# Patient Record
Sex: Female | Born: 1945
Health system: Southern US, Community
[De-identification: ages and names within clinical notes are randomized; demographics above are authoritative.]

## PROBLEM LIST (undated history)

## (undated) DIAGNOSIS — E119 Type 2 diabetes mellitus without complications: Secondary | ICD-10-CM

## (undated) DIAGNOSIS — D128 Benign neoplasm of rectum: Secondary | ICD-10-CM

## (undated) DIAGNOSIS — I872 Venous insufficiency (chronic) (peripheral): Secondary | ICD-10-CM

## (undated) DIAGNOSIS — K5790 Diverticulosis of intestine, part unspecified, without perforation or abscess without bleeding: Secondary | ICD-10-CM

## (undated) DIAGNOSIS — M6740 Ganglion, unspecified site: Secondary | ICD-10-CM

## (undated) DIAGNOSIS — E782 Mixed hyperlipidemia: Secondary | ICD-10-CM

## (undated) DIAGNOSIS — D649 Anemia, unspecified: Secondary | ICD-10-CM

## (undated) DIAGNOSIS — I10 Essential (primary) hypertension: Secondary | ICD-10-CM

## (undated) DIAGNOSIS — M109 Gout, unspecified: Secondary | ICD-10-CM

## (undated) HISTORY — PX: ABDOMINAL HYSTERECTOMY: SHX81

## (undated) HISTORY — PX: TUBAL LIGATION: SHX77

## (undated) HISTORY — PX: COLONOSCOPY: SHX174

## (undated) HISTORY — PX: DIAGNOSTIC LAPAROSCOPY: SUR761

## (undated) HISTORY — PX: HYSTEROSCOPY WITH D & C: SHX1775

---

## 2005-06-24 ENCOUNTER — Ambulatory Visit: Payer: Self-pay | Admitting: Family Medicine

## 2005-08-10 ENCOUNTER — Ambulatory Visit: Payer: Self-pay

## 2006-08-15 ENCOUNTER — Ambulatory Visit: Payer: Self-pay | Admitting: Family Medicine

## 2006-12-22 ENCOUNTER — Ambulatory Visit: Payer: Self-pay | Admitting: Family Medicine

## 2007-09-12 ENCOUNTER — Ambulatory Visit: Payer: Self-pay | Admitting: Family Medicine

## 2008-09-16 ENCOUNTER — Ambulatory Visit: Payer: Self-pay | Admitting: Family Medicine

## 2008-09-26 ENCOUNTER — Ambulatory Visit: Payer: Self-pay | Admitting: Family Medicine

## 2008-10-24 ENCOUNTER — Ambulatory Visit: Payer: Self-pay | Admitting: Family Medicine

## 2009-07-06 ENCOUNTER — Ambulatory Visit: Payer: Self-pay | Admitting: Gastroenterology

## 2009-09-28 ENCOUNTER — Ambulatory Visit: Payer: Self-pay | Admitting: Family Medicine

## 2010-09-29 ENCOUNTER — Ambulatory Visit: Payer: Self-pay | Admitting: Family Medicine

## 2010-10-11 ENCOUNTER — Ambulatory Visit: Payer: Self-pay | Admitting: Family Medicine

## 2011-01-18 ENCOUNTER — Ambulatory Visit: Payer: Self-pay | Admitting: Family Medicine

## 2011-02-01 ENCOUNTER — Ambulatory Visit: Payer: Self-pay | Admitting: Obstetrics and Gynecology

## 2011-02-01 DIAGNOSIS — I10 Essential (primary) hypertension: Secondary | ICD-10-CM

## 2011-02-07 ENCOUNTER — Ambulatory Visit: Payer: Self-pay | Admitting: Obstetrics and Gynecology

## 2011-02-09 LAB — PATHOLOGY REPORT

## 2011-04-13 ENCOUNTER — Ambulatory Visit: Payer: Self-pay | Admitting: Family Medicine

## 2011-07-25 ENCOUNTER — Ambulatory Visit: Payer: Self-pay | Admitting: Obstetrics and Gynecology

## 2011-08-03 ENCOUNTER — Inpatient Hospital Stay: Payer: Self-pay | Admitting: Obstetrics and Gynecology

## 2011-08-08 ENCOUNTER — Ambulatory Visit: Payer: Self-pay | Admitting: Obstetrics and Gynecology

## 2011-11-22 ENCOUNTER — Ambulatory Visit: Payer: Self-pay | Admitting: Family Medicine

## 2012-12-26 ENCOUNTER — Ambulatory Visit: Payer: Self-pay | Admitting: Family Medicine

## 2014-01-27 ENCOUNTER — Ambulatory Visit: Payer: Self-pay | Admitting: Family Medicine

## 2014-06-30 DIAGNOSIS — Z8739 Personal history of other diseases of the musculoskeletal system and connective tissue: Secondary | ICD-10-CM | POA: Insufficient documentation

## 2014-08-01 DIAGNOSIS — Z86010 Personal history of colonic polyps: Secondary | ICD-10-CM | POA: Insufficient documentation

## 2014-08-19 ENCOUNTER — Ambulatory Visit: Payer: Self-pay | Admitting: Gastroenterology

## 2015-01-28 ENCOUNTER — Ambulatory Visit: Payer: Self-pay | Admitting: Family Medicine

## 2016-01-14 ENCOUNTER — Other Ambulatory Visit: Payer: Self-pay | Admitting: Family Medicine

## 2016-01-14 DIAGNOSIS — Z7189 Other specified counseling: Secondary | ICD-10-CM | POA: Insufficient documentation

## 2016-01-14 DIAGNOSIS — Z7185 Encounter for immunization safety counseling: Secondary | ICD-10-CM | POA: Insufficient documentation

## 2016-01-14 DIAGNOSIS — Z1231 Encounter for screening mammogram for malignant neoplasm of breast: Secondary | ICD-10-CM

## 2016-02-01 ENCOUNTER — Other Ambulatory Visit: Payer: Self-pay | Admitting: Family Medicine

## 2016-02-01 ENCOUNTER — Ambulatory Visit
Admission: RE | Admit: 2016-02-01 | Discharge: 2016-02-01 | Disposition: A | Discharge status: Home / Self Care | Payer: Medicare Other | Source: Ambulatory Visit | Attending: Family Medicine | Admitting: Family Medicine

## 2016-02-01 DIAGNOSIS — Z1231 Encounter for screening mammogram for malignant neoplasm of breast: Secondary | ICD-10-CM

## 2016-09-16 DIAGNOSIS — Z0000 Encounter for general adult medical examination without abnormal findings: Secondary | ICD-10-CM | POA: Insufficient documentation

## 2017-03-06 ENCOUNTER — Other Ambulatory Visit: Payer: Self-pay | Admitting: Family Medicine

## 2017-03-06 DIAGNOSIS — Z1231 Encounter for screening mammogram for malignant neoplasm of breast: Secondary | ICD-10-CM

## 2017-04-03 ENCOUNTER — Ambulatory Visit
Admission: RE | Admit: 2017-04-03 | Discharge: 2017-04-03 | Disposition: A | Discharge status: Home / Self Care | Payer: Medicare Other | Source: Ambulatory Visit | Attending: Family Medicine | Admitting: Family Medicine

## 2017-04-03 DIAGNOSIS — Z1231 Encounter for screening mammogram for malignant neoplasm of breast: Secondary | ICD-10-CM | POA: Diagnosis present

## 2018-03-08 ENCOUNTER — Other Ambulatory Visit: Payer: Self-pay | Admitting: Emergency Medicine

## 2018-06-20 ENCOUNTER — Other Ambulatory Visit: Payer: Self-pay | Admitting: Family Medicine

## 2018-06-20 DIAGNOSIS — Z1231 Encounter for screening mammogram for malignant neoplasm of breast: Secondary | ICD-10-CM

## 2018-06-29 DIAGNOSIS — R011 Cardiac murmur, unspecified: Secondary | ICD-10-CM | POA: Insufficient documentation

## 2018-07-10 ENCOUNTER — Ambulatory Visit
Admission: RE | Admit: 2018-07-10 | Discharge: 2018-07-10 | Disposition: A | Discharge status: Home / Self Care | Payer: Medicare Other | Source: Ambulatory Visit | Attending: Family Medicine | Admitting: Family Medicine

## 2018-07-10 DIAGNOSIS — Z1231 Encounter for screening mammogram for malignant neoplasm of breast: Secondary | ICD-10-CM | POA: Diagnosis not present

## 2019-03-06 DIAGNOSIS — N183 Chronic kidney disease, stage 3 unspecified: Secondary | ICD-10-CM | POA: Insufficient documentation

## 2019-08-02 ENCOUNTER — Other Ambulatory Visit: Payer: Self-pay | Admitting: Family Medicine

## 2019-08-02 DIAGNOSIS — Z1231 Encounter for screening mammogram for malignant neoplasm of breast: Secondary | ICD-10-CM

## 2019-09-04 ENCOUNTER — Ambulatory Visit
Admission: RE | Admit: 2019-09-04 | Discharge: 2019-09-04 | Disposition: A | Discharge status: Home / Self Care | Payer: Medicare Other | Source: Ambulatory Visit | Attending: Family Medicine | Admitting: Family Medicine

## 2019-09-04 DIAGNOSIS — Z1231 Encounter for screening mammogram for malignant neoplasm of breast: Secondary | ICD-10-CM | POA: Insufficient documentation

## 2019-11-04 ENCOUNTER — Other Ambulatory Visit
Admission: RE | Admit: 2019-11-04 | Discharge: 2019-11-04 | Disposition: A | Discharge status: Home / Self Care | Payer: Medicare Other | Source: Ambulatory Visit | Attending: Internal Medicine | Admitting: Internal Medicine

## 2019-11-04 ENCOUNTER — Other Ambulatory Visit: Payer: Self-pay

## 2019-11-04 DIAGNOSIS — Z01812 Encounter for preprocedural laboratory examination: Secondary | ICD-10-CM | POA: Diagnosis present

## 2019-11-04 DIAGNOSIS — Z20828 Contact with and (suspected) exposure to other viral communicable diseases: Secondary | ICD-10-CM | POA: Diagnosis not present

## 2019-11-04 LAB — SARS CORONAVIRUS 2 (TAT 6-24 HRS): SARS Coronavirus 2: NEGATIVE

## 2019-11-06 ENCOUNTER — Encounter: Payer: Self-pay | Admitting: *Deleted

## 2019-11-07 ENCOUNTER — Ambulatory Visit
Admission: RE | Admit: 2019-11-07 | Discharge: 2019-11-07 | Disposition: A | Discharge status: Home / Self Care | Payer: Medicare Other | Attending: Internal Medicine | Admitting: Internal Medicine

## 2019-11-07 ENCOUNTER — Ambulatory Visit: Payer: Medicare Other | Admitting: Certified Registered Nurse Anesthetist

## 2019-11-07 ENCOUNTER — Encounter
Admission: RE | Disposition: A | Discharge status: Home / Self Care | Payer: Self-pay | Source: Home / Self Care | Attending: Internal Medicine

## 2019-11-07 ENCOUNTER — Other Ambulatory Visit: Payer: Self-pay

## 2019-11-07 ENCOUNTER — Encounter: Payer: Self-pay | Admitting: *Deleted

## 2019-11-07 DIAGNOSIS — K640 First degree hemorrhoids: Secondary | ICD-10-CM | POA: Insufficient documentation

## 2019-11-07 DIAGNOSIS — Z1211 Encounter for screening for malignant neoplasm of colon: Secondary | ICD-10-CM | POA: Insufficient documentation

## 2019-11-07 DIAGNOSIS — Z7984 Long term (current) use of oral hypoglycemic drugs: Secondary | ICD-10-CM | POA: Insufficient documentation

## 2019-11-07 DIAGNOSIS — Z79899 Other long term (current) drug therapy: Secondary | ICD-10-CM | POA: Insufficient documentation

## 2019-11-07 DIAGNOSIS — I10 Essential (primary) hypertension: Secondary | ICD-10-CM | POA: Diagnosis not present

## 2019-11-07 DIAGNOSIS — K5730 Diverticulosis of large intestine without perforation or abscess without bleeding: Secondary | ICD-10-CM | POA: Diagnosis not present

## 2019-11-07 DIAGNOSIS — Z86010 Personal history of colonic polyps: Secondary | ICD-10-CM | POA: Insufficient documentation

## 2019-11-07 DIAGNOSIS — E782 Mixed hyperlipidemia: Secondary | ICD-10-CM | POA: Insufficient documentation

## 2019-11-07 DIAGNOSIS — M109 Gout, unspecified: Secondary | ICD-10-CM | POA: Diagnosis not present

## 2019-11-07 DIAGNOSIS — E119 Type 2 diabetes mellitus without complications: Secondary | ICD-10-CM | POA: Insufficient documentation

## 2019-11-07 DIAGNOSIS — I872 Venous insufficiency (chronic) (peripheral): Secondary | ICD-10-CM | POA: Diagnosis not present

## 2019-11-07 DIAGNOSIS — Z6839 Body mass index (BMI) 39.0-39.9, adult: Secondary | ICD-10-CM | POA: Diagnosis not present

## 2019-11-07 DIAGNOSIS — Z7982 Long term (current) use of aspirin: Secondary | ICD-10-CM | POA: Insufficient documentation

## 2019-11-07 HISTORY — DX: Ganglion, unspecified site: M67.40

## 2019-11-07 HISTORY — DX: Essential (primary) hypertension: I10

## 2019-11-07 HISTORY — DX: Diverticulosis of intestine, part unspecified, without perforation or abscess without bleeding: K57.90

## 2019-11-07 HISTORY — DX: Gout, unspecified: M10.9

## 2019-11-07 HISTORY — DX: Venous insufficiency (chronic) (peripheral): I87.2

## 2019-11-07 HISTORY — PX: COLONOSCOPY WITH PROPOFOL: SHX5780

## 2019-11-07 HISTORY — DX: Anemia, unspecified: D64.9

## 2019-11-07 HISTORY — DX: Morbid (severe) obesity due to excess calories: E66.01

## 2019-11-07 HISTORY — DX: Benign neoplasm of rectum: D12.8

## 2019-11-07 HISTORY — DX: Mixed hyperlipidemia: E78.2

## 2019-11-07 HISTORY — DX: Type 2 diabetes mellitus without complications: E11.9

## 2019-11-07 LAB — GLUCOSE, CAPILLARY: Glucose-Capillary: 150 mg/dL — ABNORMAL HIGH (ref 70–99)

## 2019-11-07 SURGERY — COLONOSCOPY WITH PROPOFOL
Anesthesia: General

## 2019-11-07 MED ORDER — PROPOFOL 10 MG/ML IV BOLUS
INTRAVENOUS | Status: AC
  Filled 2019-11-07: qty 100

## 2019-11-07 MED ORDER — SODIUM CHLORIDE 0.9 % IV SOLN
INTRAVENOUS | Status: DC
  Administered 2019-11-07: 1000 mL via INTRAVENOUS

## 2019-11-07 MED ORDER — PROPOFOL 500 MG/50ML IV EMUL
INTRAVENOUS | Status: DC | PRN
  Administered 2019-11-07: 100 ug/kg/min via INTRAVENOUS

## 2019-11-07 MED ORDER — LIDOCAINE HCL (CARDIAC) PF 100 MG/5ML IV SOSY
PREFILLED_SYRINGE | INTRAVENOUS | Status: DC | PRN
  Administered 2019-11-07: 100 mg via INTRAVENOUS

## 2019-11-07 MED ORDER — PROPOFOL 10 MG/ML IV BOLUS
INTRAVENOUS | Status: DC | PRN
  Administered 2019-11-07: 50 mg via INTRAVENOUS

## 2019-11-07 NOTE — H&P (Signed)
Outpatient short stay form Pre-procedure 11/07/2019 8:30 AM Crystal Terrell, M.D.  Primary Physician: Dion Body, M.D.  Reason for visit:  Personal hx of adenomatous colon polyps (2010)  History of present illness:                            Patient presents for colonoscopy for a personal hx of colon polyps. The patient denies abdominal pain, abnormal weight loss or rectal bleeding.     Current Facility-Administered Medications:  .  0.9 %  sodium chloride infusion, , Intravenous, Continuous, [place], Benay Pike, MD  Medications Prior to Admission  Medication Sig Dispense Refill Last Dose  . allopurinol (ZYLOPRIM) 100 MG tablet Take 100 mg by mouth daily.     Marland Kitchen amLODipine (NORVASC) 10 MG tablet Take 10 mg by mouth daily.   11/07/2019 at Unknown time  . aspirin EC 81 MG tablet Take 81 mg by mouth daily.     . fenofibrate 160 MG tablet Take 160 mg by mouth daily.     . furosemide (LASIX) 40 MG tablet Take 40 mg by mouth.     Marland Kitchen glimepiride (AMARYL) 4 MG tablet Take 4 mg by mouth 2 (two) times daily with a meal.     . losartan (COZAAR) 100 MG tablet Take 100 mg by mouth daily.   11/07/2019 at Unknown time  . lovastatin (MEVACOR) 20 MG tablet Take 20 mg by mouth daily.     . metoprolol succinate (TOPROL-XL) 100 MG 24 hr tablet Take 100 mg by mouth daily. Take with or immediately following a meal.   11/07/2019 at Unknown time  . naproxen sodium (ALEVE) 220 MG tablet Take 220 mg by mouth daily as needed.        Allergies  Allergen Reactions  . Metformin And Related   . Sulfa Antibiotics      Past Medical History:  Diagnosis Date  . Adenoma of rectum   . Anemia   . Diabetes mellitus without complication ([place])   . Diverticulosis   . Ganglion cyst    right  . Gout   . Hypertension   . Mixed hyperlipidemia   . Morbid obesity with BMI of 40.0-44.9, adult ([place])   . Venous insufficiency of lower extremity     Review of systems:  Otherwise negative.    Physical  Exam  Gen: Alert, oriented. Appears stated age.  HEENT: [place]/AT. PERRLA. Lungs: CTA, no wheezes. CV: RR nl S1, S2. Abd: soft, benign, no masses. BS+ Ext: No edema. Pulses 2+    Planned procedures: Proceed with colonoscopy. The patient understands the nature of the planned procedure, indications, risks, alternatives and potential complications including but not limited to bleeding, infection, perforation, damage to internal organs and possible oversedation/side effects from anesthesia. The patient agrees and gives consent to proceed.  Please refer to procedure notes for findings, recommendations and patient disposition/instructions.     Crystal Terrell, M.D. Gastroenterology 11/07/2019  8:30 AM

## 2019-11-07 NOTE — Transfer of Care (Signed)
Immediate Anesthesia Transfer of Care Note  Patient: Crystal Terrell  Procedure(s) Performed: COLONOSCOPY WITH PROPOFOL (N/A )  Patient Location: PACU and Endoscopy Unit  Anesthesia Type:General  Level of Consciousness: awake, drowsy and patient cooperative  Airway & Oxygen Therapy: Patient Spontanous Breathing  Post-op Assessment: Report given to RN and Post -op Vital signs reviewed and stable  Post vital signs: Reviewed and stable  Last Vitals:  Vitals Value Taken Time  BP    Temp    Pulse    Resp    SpO2      Last Pain:  Vitals:   11/07/19 0824  TempSrc: Skin  PainSc: 0-No pain         Complications: No apparent anesthesia complications

## 2019-11-07 NOTE — Anesthesia Postprocedure Evaluation (Signed)
Anesthesia Post Note  Patient: Crystal Terrell  Procedure(s) Performed: COLONOSCOPY WITH PROPOFOL (N/A )  Patient location during evaluation: Endoscopy Anesthesia Type: General Level of consciousness: awake and alert Pain management: pain level controlled Vital Signs Assessment: post-procedure vital signs reviewed and stable Respiratory status: spontaneous breathing, nonlabored ventilation, respiratory function stable and patient connected to nasal cannula oxygen Cardiovascular status: blood pressure returned to baseline and stable Postop Assessment: no apparent nausea or vomiting Anesthetic complications: no     Last Vitals:  Vitals:   11/07/19 0959 11/07/19 1009  BP: 134/65 (!) 142/70  Pulse: (!) 54 (!) 59  Resp: 16 12  Temp:    SpO2: 100% 99%    Last Pain:  Vitals:   11/07/19 1009  TempSrc:   PainSc: 0-No pain                 Martha Clan

## 2019-11-07 NOTE — Op Note (Signed)
[facility] Gastroenterology Patient Name: Crystal Terrell Procedure Date: 11/07/2019 9:10 AM MRN: [id number] Account #: [id number] Date of Birth: 04-04-1946 Admit Type: Outpatient Age: 73 Room: [facility] ENDO ROOM 3 Gender: Female Note Status: Finalized Procedure:             Colonoscopy Indications:           High risk colon cancer surveillance: Personal history                         of non-advanced adenoma Providers:             Benay Pike. Nidia Grogan MD, MD Medicines:             Propofol per Anesthesia Complications:         No immediate complications. Procedure:             Pre-Anesthesia Assessment:                        - The risks and benefits of the procedure and the                         sedation options and risks were discussed with the                         patient. All questions were answered and informed                         consent was obtained.                        - Patient identification and proposed procedure were                         verified prior to the procedure by the nurse. The                         procedure was verified in the procedure room.                        - ASA Grade Assessment: III - A patient with severe                         systemic disease.                        - After reviewing the risks and benefits, the patient                         was deemed in satisfactory condition to undergo the                         procedure.                        After obtaining informed consent, the colonoscope was                         passed under direct vision. Throughout the procedure,  the patient's blood pressure, pulse, and oxygen                         saturations were monitored continuously. The                         Colonoscope was introduced through the anus and                         advanced to the the cecum, identified by appendiceal                         orifice and ileocecal  valve. The colonoscopy was                         performed without difficulty. The patient tolerated                         the procedure well. The quality of the bowel                         preparation was adequate. The ileocecal valve,                         appendiceal orifice, and rectum were photographed. Findings:      The perianal and digital rectal examinations were normal. Pertinent       negatives include normal sphincter tone and no palpable rectal lesions.      Many small-mouthed diverticula were found in the sigmoid colon.      Non-bleeding internal hemorrhoids were found during retroflexion. The       hemorrhoids were Grade I (internal hemorrhoids that do not prolapse).      The exam was otherwise without abnormality. Impression:            - Diverticulosis in the sigmoid colon.                        - Non-bleeding internal hemorrhoids.                        - The examination was otherwise normal.                        - No specimens collected. Recommendation:        - Patient has a contact number available for                         emergencies. The signs and symptoms of potential                         delayed complications were discussed with the patient.                         Return to normal activities tomorrow. Written                         discharge instructions were provided to the patient.                        -  Resume previous diet.                        - Continue present medications.                        - No repeat colonoscopy due to current age (48 years                         or older) and the absence of advanced adenomas.                        - Return to GI office PRN.                        - The findings and recommendations were discussed with                         the patient. Procedure Code(s):     --- Professional ---                        [id number], Colorectal cancer screening; colonoscopy on                         individual at  high risk Diagnosis Code(s):     --- Professional ---                        K57.30, Diverticulosis of large intestine without                         perforation or abscess without bleeding                        K64.0, First degree hemorrhoids                        Z86.010, Personal history of colonic polyps CPT copyright 2019 American Medical Association. All rights reserved. The codes documented in this report are preliminary and upon coder review may  be revised to meet current compliance requirements. Efrain Sella MD, MD 11/07/2019 9:38:45 AM This report has been signed electronically. Number of Addenda: 0 Note Initiated On: 11/07/2019 9:10 AM Scope Withdrawal Time: 0 hours 5 minutes 33 seconds  Total Procedure Duration: 0 hours 12 minutes 40 seconds  Estimated Blood Loss:  Estimated blood loss: none.      [facility]

## 2019-11-07 NOTE — Interval H&P Note (Signed)
History and Physical Interval Note:  11/07/2019 8:31 AM  Crystal Terrell  has presented today for surgery, with the diagnosis of PERSONAL HX.OF COLON POLYPS.  The various methods of treatment have been discussed with the patient and family. After consideration of risks, benefits and other options for treatment, the patient has consented to  Procedure(s): COLONOSCOPY WITH PROPOFOL (N/A) as a surgical intervention.  The patient's history has been reviewed, patient examined, no change in status, stable for surgery.  I have reviewed the patient's chart and labs.  Questions were answered to the patient's satisfaction.     [place], [place]

## 2019-11-07 NOTE — Anesthesia Preprocedure Evaluation (Signed)
Anesthesia Evaluation  Patient identified by MRN, date of birth, ID band Patient awake    Reviewed: Allergy & Precautions, H&P , NPO status , Patient's Chart, lab work & pertinent test results, reviewed documented beta blocker date and time   History of Anesthesia Complications Negative for: history of anesthetic complications  Airway Mallampati: IV  TM Distance: >3 FB Neck ROM: full    Dental  (+) Dental Advidsory Given, Partial Upper, Edentulous Lower   Pulmonary neg pulmonary ROS,    Pulmonary exam normal        Cardiovascular Exercise Tolerance: Good hypertension, (-) angina(-) Past MI and (-) Cardiac Stents Normal cardiovascular exam(-) dysrhythmias + Valvular Problems/Murmurs      Neuro/Psych negative neurological ROS  negative psych ROS   GI/Hepatic negative GI ROS, Neg liver ROS,   Endo/Other  diabetesMorbid obesity  Renal/GU negative Renal ROS  negative genitourinary   Musculoskeletal   Abdominal   Peds  Hematology  (+) Blood dyscrasia, anemia ,   Anesthesia Other Findings Past Medical History: No date: Adenoma of rectum No date: Anemia No date: Diabetes mellitus without complication (HCC) No date: Diverticulosis No date: Ganglion cyst     Comment:  right No date: Gout No date: Hypertension No date: Mixed hyperlipidemia No date: Morbid obesity with BMI of 40.0-44.9, adult (HCC) No date: Venous insufficiency of lower extremity   Reproductive/Obstetrics negative OB ROS                             Anesthesia Physical Anesthesia Plan  ASA: III  Anesthesia Plan: General   Post-op Pain Management:    Induction: Intravenous  PONV Risk Score and Plan: 3 and Propofol infusion and TIVA  Airway Management Planned: Natural Airway and Nasal Cannula  Additional Equipment:   Intra-op Plan:   Post-operative Plan:   Informed Consent: I have reviewed the patients History  and Physical, chart, labs and discussed the procedure including the risks, benefits and alternatives for the proposed anesthesia with the patient or authorized representative who has indicated his/her understanding and acceptance.     Dental Advisory Given  Plan Discussed with: Anesthesiologist, CRNA and Surgeon  Anesthesia Plan Comments:         Anesthesia Quick Evaluation

## 2019-11-07 NOTE — Anesthesia Post-op Follow-up Note (Signed)
Anesthesia QCDR form completed.        

## 2019-11-08 ENCOUNTER — Encounter: Payer: Self-pay | Admitting: Internal Medicine

## 2019-12-18 ENCOUNTER — Emergency Department: Payer: Medicare Other

## 2019-12-18 ENCOUNTER — Inpatient Hospital Stay
Admission: EM | Admit: 2019-12-18 | Discharge: 2019-12-21 | DRG: 175 | Disposition: A | Discharge status: Home / Self Care | Payer: Medicare Other | Attending: Internal Medicine | Admitting: Internal Medicine

## 2019-12-18 ENCOUNTER — Inpatient Hospital Stay: Payer: Medicare Other

## 2019-12-18 ENCOUNTER — Other Ambulatory Visit: Payer: Self-pay

## 2019-12-18 DIAGNOSIS — Z86718 Personal history of other venous thrombosis and embolism: Secondary | ICD-10-CM

## 2019-12-18 DIAGNOSIS — Z7982 Long term (current) use of aspirin: Secondary | ICD-10-CM

## 2019-12-18 DIAGNOSIS — E1165 Type 2 diabetes mellitus with hyperglycemia: Secondary | ICD-10-CM | POA: Diagnosis present

## 2019-12-18 DIAGNOSIS — I248 Other forms of acute ischemic heart disease: Secondary | ICD-10-CM | POA: Diagnosis present

## 2019-12-18 DIAGNOSIS — I2609 Other pulmonary embolism with acute cor pulmonale: Secondary | ICD-10-CM | POA: Diagnosis not present

## 2019-12-18 DIAGNOSIS — I130 Hypertensive heart and chronic kidney disease with heart failure and stage 1 through stage 4 chronic kidney disease, or unspecified chronic kidney disease: Secondary | ICD-10-CM | POA: Diagnosis present

## 2019-12-18 DIAGNOSIS — E1169 Type 2 diabetes mellitus with other specified complication: Secondary | ICD-10-CM

## 2019-12-18 DIAGNOSIS — N183 Chronic kidney disease, stage 3 unspecified: Secondary | ICD-10-CM | POA: Diagnosis present

## 2019-12-18 DIAGNOSIS — Z79899 Other long term (current) drug therapy: Secondary | ICD-10-CM

## 2019-12-18 DIAGNOSIS — Z20822 Contact with and (suspected) exposure to covid-19: Secondary | ICD-10-CM | POA: Diagnosis present

## 2019-12-18 DIAGNOSIS — Z882 Allergy status to sulfonamides status: Secondary | ICD-10-CM

## 2019-12-18 DIAGNOSIS — I10 Essential (primary) hypertension: Secondary | ICD-10-CM | POA: Diagnosis not present

## 2019-12-18 DIAGNOSIS — Z6841 Body Mass Index (BMI) 40.0 and over, adult: Secondary | ICD-10-CM

## 2019-12-18 DIAGNOSIS — I82452 Acute embolism and thrombosis of left peroneal vein: Secondary | ICD-10-CM | POA: Diagnosis present

## 2019-12-18 DIAGNOSIS — Z888 Allergy status to other drugs, medicaments and biological substances status: Secondary | ICD-10-CM

## 2019-12-18 DIAGNOSIS — Z86711 Personal history of pulmonary embolism: Secondary | ICD-10-CM | POA: Diagnosis present

## 2019-12-18 DIAGNOSIS — M109 Gout, unspecified: Secondary | ICD-10-CM | POA: Diagnosis present

## 2019-12-18 DIAGNOSIS — I2699 Other pulmonary embolism without acute cor pulmonale: Secondary | ICD-10-CM | POA: Diagnosis present

## 2019-12-18 DIAGNOSIS — E119 Type 2 diabetes mellitus without complications: Secondary | ICD-10-CM | POA: Diagnosis not present

## 2019-12-18 DIAGNOSIS — I509 Heart failure, unspecified: Secondary | ICD-10-CM | POA: Diagnosis present

## 2019-12-18 DIAGNOSIS — J9601 Acute respiratory failure with hypoxia: Secondary | ICD-10-CM | POA: Diagnosis present

## 2019-12-18 DIAGNOSIS — E782 Mixed hyperlipidemia: Secondary | ICD-10-CM | POA: Diagnosis present

## 2019-12-18 DIAGNOSIS — E1122 Type 2 diabetes mellitus with diabetic chronic kidney disease: Secondary | ICD-10-CM | POA: Diagnosis present

## 2019-12-18 DIAGNOSIS — D631 Anemia in chronic kidney disease: Secondary | ICD-10-CM | POA: Diagnosis present

## 2019-12-18 DIAGNOSIS — R0602 Shortness of breath: Secondary | ICD-10-CM

## 2019-12-18 DIAGNOSIS — R0902 Hypoxemia: Secondary | ICD-10-CM | POA: Diagnosis not present

## 2019-12-18 LAB — APTT: aPTT: 29 s (ref 24–36)

## 2019-12-18 LAB — CBC WITH DIFFERENTIAL/PLATELET
Abs Immature Granulocytes: 0.03 10*3/uL (ref 0.00–0.07)
Basophils Absolute: 0 10*3/uL (ref 0.0–0.1)
Basophils Relative: 1 %
Eosinophils Absolute: 0 10*3/uL (ref 0.0–0.5)
Eosinophils Relative: 0 %
HCT: 38.5 % (ref 36.0–46.0)
Hemoglobin: 12.5 g/dL (ref 12.0–15.0)
Immature Granulocytes: 0 %
Lymphocytes Relative: 25 %
Lymphs Abs: 1.8 10*3/uL (ref 0.7–4.0)
MCH: 26.9 pg (ref 26.0–34.0)
MCHC: 32.5 g/dL (ref 30.0–36.0)
MCV: 82.8 fL (ref 80.0–100.0)
Monocytes Absolute: 0.5 10*3/uL (ref 0.1–1.0)
Monocytes Relative: 7 %
Neutro Abs: 5 10*3/uL (ref 1.7–7.7)
Neutrophils Relative %: 67 %
Platelets: 174 10*3/uL (ref 150–400)
RBC: 4.65 MIL/uL (ref 3.87–5.11)
RDW: 13.1 % (ref 11.5–15.5)
WBC: 7.5 10*3/uL (ref 4.0–10.5)
nRBC: 0 % (ref 0.0–0.2)

## 2019-12-18 LAB — COMPREHENSIVE METABOLIC PANEL
ALT: 22 U/L (ref 0–44)
AST: 28 U/L (ref 15–41)
Albumin: 4.4 g/dL (ref 3.5–5.0)
Alkaline Phosphatase: 61 U/L (ref 38–126)
Anion gap: 14 (ref 5–15)
BUN: 29 mg/dL — ABNORMAL HIGH (ref 8–23)
CO2: 26 mmol/L (ref 22–32)
Calcium: 9.9 mg/dL (ref 8.9–10.3)
Chloride: 101 mmol/L (ref 98–111)
Creatinine, Ser: 1.38 mg/dL — ABNORMAL HIGH (ref 0.44–1.00)
GFR calc Af Amer: 44 mL/min — ABNORMAL LOW (ref >60)
GFR calc non Af Amer: 38 mL/min — ABNORMAL LOW (ref >60)
Glucose, Bld: 233 mg/dL — ABNORMAL HIGH (ref 70–99)
Potassium: 3.7 mmol/L (ref 3.5–5.1)
Sodium: 141 mmol/L (ref 135–145)
Total Bilirubin: 0.9 mg/dL (ref 0.3–1.2)
Total Protein: 8 g/dL (ref 6.5–8.1)

## 2019-12-18 LAB — PROTIME-INR
INR: 1 (ref 0.8–1.2)
Prothrombin Time: 13.1 seconds (ref 11.4–15.2)

## 2019-12-18 LAB — GLUCOSE, CAPILLARY: Glucose-Capillary: 125 mg/dL — ABNORMAL HIGH (ref 70–99)

## 2019-12-18 LAB — TROPONIN I (HIGH SENSITIVITY): Troponin I (High Sensitivity): 64 ng/L — ABNORMAL HIGH (ref <18)

## 2019-12-18 LAB — PROCALCITONIN: Procalcitonin: <0.1 ng/mL

## 2019-12-18 LAB — RESPIRATORY PANEL BY RT PCR (FLU A&B, COVID)
Influenza A by PCR: NEGATIVE
Influenza B by PCR: NEGATIVE
SARS Coronavirus 2 by RT PCR: NEGATIVE

## 2019-12-18 LAB — POC SARS CORONAVIRUS 2 AG: SARS Coronavirus 2 Ag: NEGATIVE

## 2019-12-18 LAB — BRAIN NATRIURETIC PEPTIDE: B Natriuretic Peptide: 80 pg/mL (ref 0.0–100.0)

## 2019-12-18 LAB — MRSA PCR SCREENING: MRSA by PCR: NEGATIVE

## 2019-12-18 MED ORDER — CHLORHEXIDINE GLUCONATE CLOTH 2 % EX PADS
6.0000 | MEDICATED_PAD | Freq: Every day | CUTANEOUS | Status: DC
  Administered 2019-12-18: 6 via TOPICAL

## 2019-12-18 MED ORDER — ACETAMINOPHEN 325 MG PO TABS: 650.0000 mg | ORAL_TABLET | ORAL | Status: DC | PRN

## 2019-12-18 MED ORDER — INSULIN ASPART 100 UNIT/ML ~~LOC~~ SOLN
0.0000 [IU] | SUBCUTANEOUS | Status: DC
  Administered 2019-12-18 – 2019-12-19 (×2): 2 [IU] via SUBCUTANEOUS
  Administered 2019-12-19: 3 [IU] via SUBCUTANEOUS
  Administered 2019-12-20: 2 [IU] via SUBCUTANEOUS
  Administered 2019-12-20: 12:00:00 3 [IU] via SUBCUTANEOUS
  Administered 2019-12-20: 05:00:00 2 [IU] via SUBCUTANEOUS
  Administered 2019-12-20 – 2019-12-21 (×2): 3 [IU] via SUBCUTANEOUS
  Filled 2019-12-18 (×8): qty 1

## 2019-12-18 MED ORDER — HEPARIN BOLUS VIA INFUSION
4400.0000 [IU] | Freq: Once | INTRAVENOUS | Status: AC
  Administered 2019-12-18: 4400 [IU] via INTRAVENOUS
  Filled 2019-12-18: qty 4400

## 2019-12-18 MED ORDER — ONDANSETRON HCL 4 MG/2ML IJ SOLN: 4.0000 mg | Freq: Four times a day (QID) | INTRAMUSCULAR | Status: DC | PRN

## 2019-12-18 MED ORDER — PANTOPRAZOLE SODIUM 40 MG IV SOLR
40.0000 mg | Freq: Every day | INTRAVENOUS | Status: DC
  Administered 2019-12-18: 40 mg via INTRAVENOUS
  Filled 2019-12-18 (×2): qty 40

## 2019-12-18 MED ORDER — LACTATED RINGERS IV SOLN: INTRAVENOUS | Status: DC

## 2019-12-18 MED ORDER — HEPARIN (PORCINE) 25000 UT/250ML-% IV SOLN
1250.0000 [IU]/h | INTRAVENOUS | Status: DC
  Administered 2019-12-18: 1250 [IU]/h via INTRAVENOUS
  Filled 2019-12-18: qty 250

## 2019-12-18 MED ORDER — IPRATROPIUM-ALBUTEROL 0.5-2.5 (3) MG/3ML IN SOLN: 3.0000 mL | RESPIRATORY_TRACT | Status: DC | PRN

## 2019-12-18 MED ORDER — IOHEXOL 350 MG/ML SOLN
75.0000 mL | Freq: Once | INTRAVENOUS | Status: AC | PRN
  Administered 2019-12-18: 75 mL via INTRAVENOUS

## 2019-12-18 NOTE — ED Notes (Signed)
Pt given phone, phone charger, and glasses from family member.

## 2019-12-18 NOTE — ED Notes (Signed)
Pt's son updated on pt status.

## 2019-12-18 NOTE — ED Notes (Signed)
Pt placed on 15L nonrebreather. Now satting at 90%.

## 2019-12-18 NOTE — Progress Notes (Signed)
eLink Physician-Brief Progress Note Patient Name: JANNA BOOKER DOB: 1946-08-12 MRN: [id number]   Date of Service  12/18/2019  HPI/Events of Note  Pt admitted to the ICU at [facility] with submassive PE, awaiting possible thrombectomy by vascular surgery, she is hemodynamically stable and oxygenating satisfactorily at this point in time.  eICU Interventions  New Patient Evaluation completed.        Kerry Kass Ogan 12/18/2019, 10:33 PM

## 2019-12-18 NOTE — Consult Note (Signed)
[facility] [place]  Patient name: Crystal Terrell MRN: [id number] DOB: 02-11-1946 Sex: female   REQUESTING PROVIDER:    ER   REASON FOR CONSULT:    PE  HISTORY OF PRESENT ILLNESS:   Crystal Terrell is a 73 y.o. female, who presented to the ER omn 12-30 with complaints of shortness of breath.  SHe denies chest pain.  On presentation, she had O2 sats in the 70's on room air.  She has a history of CHF, on lasix, but does not feel fluid overloaded.  She is without leg swelling.  SHe denies fevers.  COVID testing was negative.  CXR was without evidence of fluid overload or pneumonia.  CTA reveals acute PE with right heart strain.  Her troponins were mildly elevated.  She was admitted overnight to the ICU.  She is not complaining of shortness of breath this am.  She is without chest pain  The patient suffers from morbid obesity.  SHe is a poorly controlled diabetic.  She is medically managed for hypertension.  SHe takes a statin for hypercholesterolemia.  She is a non-smoker  PAST MEDICAL HISTORY    Past Medical History:  Diagnosis Date  . Adenoma of rectum   . Anemia   . Diabetes mellitus without complication ([place])   . Diverticulosis   . Ganglion cyst    right  . Gout   . Hypertension   . Mixed hyperlipidemia   . Morbid obesity with BMI of 40.0-44.9, adult ([place])   . Venous insufficiency of lower extremity      FAMILY HISTORY   Family History  Problem Relation Age of Onset  . Breast cancer Neg Hx     SOCIAL HISTORY:   Social History   Socioeconomic History  . Marital status: Married    Spouse name: Nathaneil Canary  . Number of children: 3  . Years of education: Not on file  . Highest education level: Not on file  Occupational History  . Not on file  Tobacco Use  . Smoking status: Never Smoker  . Smokeless tobacco: Never Used  Substance and Sexual Activity  . Alcohol use: Not Currently  . Drug use: Never   . Sexual activity: Not on file  Other Topics Concern  . Not on file  Social History Narrative  . Not on file   Social Determinants of Health   Financial Resource Strain:   . Difficulty of Paying Living Expenses: Not on file  Food Insecurity:   . Worried About Charity fundraiser in the Last Year: Not on file  . Ran Out of Food in the Last Year: Not on file  Transportation Needs:   . Lack of Transportation (Medical): Not on file  . Lack of Transportation (Non-Medical): Not on file  Physical Activity:   . Days of Exercise per Week: Not on file  . Minutes of Exercise per Session: Not on file  Stress:   . Feeling of Stress : Not on file  Social Connections:   . Frequency of Communication with Friends and Family: Not on file  . Frequency of Social Gatherings with Friends and Family: Not on file  . Attends Religious Services: Not on file  . Active Member of Clubs or Organizations: Not on file  . Attends Archivist Meetings: Not on file  . Marital Status: Not on file  Intimate Partner Violence:   . Fear of Current or Ex-Partner: Not on file  . Emotionally Abused:  Not on file  . Physically Abused: Not on file  . Sexually Abused: Not on file    ALLERGIES:    Allergies  Allergen Reactions  . Metformin And Related   . Sulfa Antibiotics     CURRENT MEDICATIONS:    Current Facility-Administered Medications  Medication Dose Route Frequency Provider Last Rate Last Admin  . heparin ADULT infusion 100 units/mL (25000 units/219mL sodium chloride 0.45%)  1,250 Units/hr Intravenous Continuous Nance Pear, MD 12.5 mL/hr at 12/18/19 1927 1,250 Units/hr at 12/18/19 1927   Current Outpatient Medications  Medication Sig Dispense Refill  . allopurinol (ZYLOPRIM) 100 MG tablet Take 200 mg by mouth daily.     Marland Kitchen amLODipine (NORVASC) 10 MG tablet Take 10 mg by mouth daily.    Marland Kitchen aspirin EC 81 MG tablet Take 81 mg by mouth daily.    . Dulaglutide 1.5 MG/0.5ML SOPN Inject 1.5  mg into the skin once a week.    . fenofibrate 160 MG tablet Take 160 mg by mouth daily.    . furosemide (LASIX) 40 MG tablet Take 40 mg by mouth.    Marland Kitchen glimepiride (AMARYL) 4 MG tablet Take 4 mg by mouth 2 (two) times daily with a meal.    . losartan (COZAAR) 100 MG tablet Take 100 mg by mouth daily.    Marland Kitchen lovastatin (MEVACOR) 20 MG tablet Take 20 mg by mouth daily.    . metoprolol succinate (TOPROL-XL) 100 MG 24 hr tablet Take 100 mg by mouth daily. Take with or immediately following a meal.    . naproxen sodium (ALEVE) 220 MG tablet Take 220 mg by mouth daily as needed (pain).     . Omega-3 1000 MG CAPS Take 1,000 mg by mouth 2 (two) times daily.      REVIEW OF SYSTEMS:   [X]  denotes positive finding, [ ]  denotes negative finding Cardiac  Comments:  Chest pain or chest pressure:    Shortness of breath upon exertion: x   Short of breath when lying flat: x   Irregular heart rhythm:        Vascular    Pain in calf, thigh, or hip brought on by ambulation:    Pain in feet at night that wakes you up from your sleep:     Blood clot in your veins:    Leg swelling:         Pulmonary    Oxygen at home:    Productive cough:     Wheezing:         Neurologic    Sudden weakness in arms or legs:     Sudden numbness in arms or legs:     Sudden onset of difficulty speaking or slurred speech:    Temporary loss of vision in one eye:     Problems with dizziness:         Gastrointestinal    Blood in stool:      Vomited blood:         Genitourinary    Burning when urinating:     Blood in urine:        Psychiatric    Major depression:         Hematologic    Bleeding problems:    Problems with blood clotting too easily:        Skin    Rashes or ulcers:        Constitutional    Fever or chills:     PHYSICAL EXAM:  Vitals:   12/18/19 1715 12/18/19 1730 12/18/19 1830 12/18/19 1932  BP:  132/77 117/68 121/72  Pulse: 86 91 91 89  Resp: (!) 21 18 17 14   Temp:      TempSrc:       SpO2: 100% 100% 96% 99%  Weight:      Height:        GENERAL: The patient is a well-nourished female, in no acute distress. The vital signs are documented above. CARDIAC: There is a regular rate and rhythm.  PULMONARY: Nonlabored respirations ABDOMEN: Soft and non-tender with normal pitched bowel sounds.  MUSCULOSKELETAL: There are no major deformities or cyanosis. NEUROLOGIC: No focal weakness or paresthesias are detected. SKIN: There are no ulcers or rashes noted. PSYCHIATRIC: The patient has a normal affect.  STUDIES:   I have reviewed the CTA with the following findings: 1. Positive for acute PE with CT evidence of right heart strain (RV/LV Ratio = 1.79) consistent with at least submassive (intermediate risk) PE. The presence of right heart strain has been associated with an increased risk of morbidity and mortality. Please activate Code PE by paging [phone]. 2. Hepatic steatosis. 3. Aortic atherosclerosis. ASSESSMENT and PLAN   Acute bilateral PE:  The patient was admitted and placed on a heparin drip overnight.  She has remained stable.  O2 sats are 97% on 2L.  She is not tachycardic.  At this point, I would not recommend pulmonary thrombolysis.  I would like to see how she does with anticoagulation alone.  If she has on going shortness of breath and has difficulty weaning off O2, thrombolysis could be considered later in her hospital stay.   Leia Alf, MD, FACS [facility] [phone] Pager [phone]

## 2019-12-18 NOTE — ED Notes (Signed)
Pt placed on 6L [place], satting at 74%.

## 2019-12-18 NOTE — ED Notes (Signed)
Attempted to call report to CCU - Shanon Brow RN will call back

## 2019-12-18 NOTE — Progress Notes (Signed)
ANTICOAGULATION CONSULT NOTE - Initial Consult  Pharmacy Consult for Heparin Drip Indication: pulmonary embolus  Allergies  Allergen Reactions  . Metformin And Related   . Sulfa Antibiotics     Patient Measurements: Height: 5\' 2"  (157.5 cm) Weight: 220 lb (99.8 kg) IBW/kg (Calculated) : 50.1 Heparin Dosing Weight: 73.8 kg  Vital Signs: Temp: 98.4 F (36.9 C) (12/30 1439) Temp Source: Oral (12/30 1439) BP: 117/68 (12/30 1830) Pulse Rate: 91 (12/30 1830)  Labs: Recent Labs    12/18/19 1451  HGB 12.5  HCT 38.5  PLT 174  CREATININE 1.38*  TROPONINIHS 64*    Estimated Creatinine Clearance: 39.5 mL/min (A) (by C-G formula based on SCr of 1.38 mg/dL (H)).   Medical History: Past Medical History:  Diagnosis Date  . Adenoma of rectum   . Anemia   . Diabetes mellitus without complication ([place])   . Diverticulosis   . Ganglion cyst    right  . Gout   . Hypertension   . Mixed hyperlipidemia   . Morbid obesity with BMI of 40.0-44.9, adult ([place])   . Venous insufficiency of lower extremity     Assessment: Patient is a 73yo female admitted for PE. Pharmacy consulted for Heparin dosing.  Goal of Therapy:  Heparin level 0.3-0.7 units/ml Monitor platelets by anticoagulation protocol: Yes   Plan:  Give 4400 units bolus x 1 Start heparin infusion at 1250 units/hr Check anti-Xa level in 8 hours and daily while on heparin Continue to monitor H&H and platelets  Paulina Fusi, PharmD, BCPS 12/18/2019 7:09 PM

## 2019-12-18 NOTE — ED Notes (Signed)
  Called lab and spoke with Levada Dy and informed her that MD placed orders for bloodwork at this time. Per Levada Dy she will add on

## 2019-12-18 NOTE — Consult Note (Signed)
Name: Crystal Terrell MRN: [id number] DOB: 1946-07-27    ADMISSION DATE:  12/18/2019 CONSULTATION DATE:  12/18/2019  REFERRING MD :  Dr. Archie Balboa  CHIEF COMPLAINT:  Shortness of breath  BRIEF PATIENT DESCRIPTION:  73 y.o. Female admitted 12/18/19 with bilateral Acute submassive PE with evidence of right heart strain requiring Heparin drip.  BP currently stable.  Vascular surgery consulted for possible Thrombectomy.  Found to have occlusive to near occlusive thrombus in the left peroneal veins.  SIGNIFICANT EVENTS  12/30>> Admission to ICU  STUDIES:  CTA Chest 12/30>>1. Positive for acute PE with CT evidence of right heart strain (RV/LV Ratio = 1.79) consistent with at least submassive (intermediate risk) PE. The presence of right heart strain has been associated with an increased risk of morbidity and mortality. Please activate Code PE by paging [phone]. Numerous filling defects throughout the pulmonary arterial tree bilaterally involving the distal left main pulmonary artery as well as lobar, segmental and subsegmental sized branches to the lungs bilaterally, most of which appear nonocclusive. Overall clot burden is very high. Heart size is borderline enlarged. Right ventricle appears dilated compared with the (5.2 cm versus 2.9 cm). There is no significant pericardial fluid, thickening or pericardial calcification. Aortic atherosclerosis. No definite coronary artery calcifications. 2. Hepatic steatosis. 3. Aortic atherosclerosis.  Venous US bilateral LE 12/30>> Occlusive to near occlusive thrombus within the left peroneal veins. No DVT on the right. 2D Echocardiogram 12/31>>  CULTURES: SARS-CoV-2 PCR 12/30>> negative Influenza PCR 12/30>> negative  ANTIBIOTICS: N/A  HISTORY OF PRESENT ILLNESS:   Crystal Terrell is a 73 year old female with a past medical history notable for CKD Stage III (baseline Cr. 1.7), diabetes mellitus, hypertension, hyperlipidemia, morbid  obesity, chronic venous insufficiency, adenoma of the rectum, anemia, diverticulosis who presents to [facility] ED on 12/18/2019 with complaints of shortness of breath.  She reports the shortness of breath began earlier this morning.  She denies any associated cough, hemoptysis, chest pain, lower extremity edema, nausea, vomiting, fever, chills, abdominal pain, or sick contacts.  Upon presentation to the ED she was noted to be hypoxic with O2 sats in the 70s on room air.  Initial work-up in the ED revealed creatinine 1.38, glucose 233, BNP 80, high-sensitivity troponin 64, negative procalcitonin.  Both flu and COVID-19 PCR's are negative.  Chest x-ray was negative for any acute disease process.  CTA chest revealed bilateral submassive PE with right heart strain.  Blood pressures are currently stable.  ED physician discussed with vascular surgery, who recommended placing patient on heparin drip, with reassessment tomorrow for thrombectomy.  PCCM is asked to admit the patient for acute hypoxic respiratory failure in the setting of bilateral acute submassive pulmonary embolism with right heart strain.    She denies any provoking factors including: Recent surgery, trauma, immobility, hormone therapy, or active cancer.  She does exhibit morbid obesity.  She denies smoking.  PAST MEDICAL HISTORY :   has a past medical history of Adenoma of rectum, Anemia, Diabetes mellitus without complication ([place]), Diverticulosis, Ganglion cyst, Gout, Hypertension, Mixed hyperlipidemia, Morbid obesity with BMI of 40.0-44.9, adult ([place]), and Venous insufficiency of lower extremity.  has a past surgical history that includes Colonoscopy; Abdominal hysterectomy; Hysteroscopy with D & C; Diagnostic laparoscopy; Tubal ligation; and Colonoscopy with propofol (N/A, 11/07/2019). Prior to Admission medications   Medication Sig Start Date End Date Taking? Authorizing Provider  allopurinol (ZYLOPRIM) 100 MG tablet Take 200 mg by mouth daily.     Yes [provider]  amLODipine (NORVASC) 10 MG tablet Take 10 mg by mouth daily.   Yes [provider]  aspirin EC 81 MG tablet Take 81 mg by mouth daily.   Yes [provider]  Dulaglutide 1.5 MG/0.5ML SOPN Inject 1.5 mg into the skin once a week.   Yes [provider]  fenofibrate 160 MG tablet Take 160 mg by mouth daily.   Yes [provider]  furosemide (LASIX) 40 MG tablet Take 40 mg by mouth.   Yes [provider]  glimepiride (AMARYL) 4 MG tablet Take 4 mg by mouth 2 (two) times daily with a meal.   Yes [provider]  losartan (COZAAR) 100 MG tablet Take 100 mg by mouth daily.   Yes [provider]  lovastatin (MEVACOR) 20 MG tablet Take 20 mg by mouth daily.   Yes [provider]  metoprolol succinate (TOPROL-XL) 100 MG 24 hr tablet Take 100 mg by mouth daily. Take with or immediately following a meal.   Yes [provider]  naproxen sodium (ALEVE) 220 MG tablet Take 220 mg by mouth daily as needed (pain).    Yes [provider]  Omega-3 1000 MG CAPS Take 1,000 mg by mouth 2 (two) times daily.   Yes [provider]   Allergies  Allergen Reactions  . Metformin And Related   . Sulfa Antibiotics     FAMILY HISTORY:  family history is not on file. SOCIAL HISTORY:  reports that she has never smoked. She has never used smokeless tobacco. She reports previous alcohol use. She reports that she does not use drugs.   COVID-19 DISASTER DECLARATION:  FULL CONTACT PHYSICAL EXAMINATION WAS NOT POSSIBLE DUE TO TREATMENT OF COVID-19 AND  CONSERVATION OF PERSONAL PROTECTIVE EQUIPMENT, LIMITED EXAM FINDINGS INCLUDE-  Patient assessed or the symptoms described in the history of present illness.  In the context of the Global COVID-19 pandemic, which necessitated consideration that the patient might be at risk for infection with the SARS-CoV-2 virus that causes COVID-19, Institutional  protocols and algorithms that pertain to the evaluation of patients at risk for COVID-19 are in a state of rapid change based on information released by regulatory bodies including the CDC and federal and state organizations. These policies and algorithms were followed during the patient's care while in hospital.  REVIEW OF SYSTEMS: Positives in bold Constitutional: Negative for fever, chills, weight loss, malaise/fatigue and diaphoresis.  HENT: Negative for hearing loss, ear pain, nosebleeds, congestion, sore throat, neck pain, tinnitus and ear discharge.   Eyes: Negative for blurred vision, double vision, photophobia, pain, discharge and redness.  Respiratory: Negative for cough, hemoptysis, sputum production, shortness of breath,+dysnpnea with exertion, wheezing and stridor.   Cardiovascular: Negative for chest pain, palpitations, orthopnea, claudication, leg swelling and PND.  Gastrointestinal: Negative for heartburn, nausea, vomiting, abdominal pain, diarrhea, constipation, blood in stool and melena.  Genitourinary: Negative for dysuria, urgency, frequency, hematuria and flank pain.  Musculoskeletal: Negative for myalgias, back pain, joint pain and falls.  Skin: Negative for itching and rash.  Neurological: Negative for dizziness, tingling, tremors, sensory change, speech change, focal weakness, seizures, loss of consciousness, weakness and headaches.  Endo/Heme/Allergies: Negative for environmental allergies and polydipsia. Does not bruise/bleed easily.  SUBJECTIVE:  Pt reports dyspnea with exertion Denies SOB at rest, cough, sputum, chest pain, palpitations, N/V, abdominal pain, fever, chills, or sick contacts On NRB mask  VITAL SIGNS: Temp:  [98.4 F (36.9 C)] 98.4 F (36.9 C) (12/30 1439) Pulse Rate:  [  79-91] 89 (12/30 1932) Resp:  [10-26] 14 (12/30 1932) BP: (117-150)/(68-86) 121/72 (12/30 1932) SpO2:  [80 %-100 %] 99 % (12/30 1932) Weight:  [99.8 kg] 99.8 kg (12/30  1440)  PHYSICAL EXAMINATION: General:  Acute on chronically ill appearing female, sitting on bed, on NRB mask, in No acute distress Neuro:  Awake, A&O x4, follows commands, no focal deficits, speech clear HEENT:  Atraumatic, normocephalic, neck supple, no JVD Cardiovascular:  Regular rate & rhythm, s1s2 noted, no M/R/G, 2+ pulses throughout Lungs:  Clear to auscultation bilaterally, no wheezing, even, nonlabored, normal effort Abdomen:  Obese, soft, nontender,nondistended, no guarding or rebound tenderness, BS+ x4 Musculoskeletal:  Normal bulk and tone, no deformities, no edema Skin:  Warm and dry.  No obvious rashes, lesions, or ulcerations  Recent Labs  Lab 12/18/19 1451  NA 141  K 3.7  CL 101  CO2 26  BUN 29*  CREATININE 1.38*  GLUCOSE 233*   Recent Labs  Lab 12/18/19 1451  HGB 12.5  HCT 38.5  WBC 7.5  PLT 174   CT Angio Chest PE W and/or Wo Contrast  Result Date: 12/18/2019 CLINICAL DATA:  73 year old female with history of hypoxia. Shortness of breath. Weakness. EXAM: CT ANGIOGRAPHY CHEST WITH CONTRAST TECHNIQUE: Multidetector CT imaging of the chest was performed using the standard protocol during bolus administration of intravenous contrast. Multiplanar CT image reconstructions and MIPs were obtained to evaluate the vascular anatomy. CONTRAST:  34mL OMNIPAQUE IOHEXOL 350 MG/ML SOLN COMPARISON:  No priors. FINDINGS: Cardiovascular: Numerous filling defects throughout the pulmonary arterial tree bilaterally involving the distal left main pulmonary artery as well as lobar, segmental and subsegmental sized branches to the lungs bilaterally, most of which appear nonocclusive. Overall clot burden is very high. Heart size is borderline enlarged. Right ventricle appears dilated compared with the (5.2 cm versus 2.9 cm). There is no significant pericardial fluid, thickening or pericardial calcification. Aortic atherosclerosis. No definite coronary artery calcifications.  Mediastinum/Nodes: No pathologically enlarged mediastinal or hilar lymph nodes. Esophagus is unremarkable in appearance. No axillary lymphadenopathy. Lungs/Pleura: No suspicious appearing pulmonary nodules or masses are noted. No acute consolidative airspace disease. No pleural effusions. Upper Abdomen: Diffuse low attenuation throughout the visualized hepatic parenchyma, indicative of hepatic steatosis. Musculoskeletal: There are no aggressive appearing lytic or blastic lesions noted in the visualized portions of the skeleton. Review of the MIP images confirms the above findings. IMPRESSION: 1. Positive for acute PE with CT evidence of right heart strain (RV/LV Ratio = 1.79) consistent with at least submassive (intermediate risk) PE. The presence of right heart strain has been associated with an increased risk of morbidity and mortality. Please activate Code PE by paging [phone]. 2. Hepatic steatosis. 3. Aortic atherosclerosis. Critical Value/emergent results were called by telephone at the time of interpretation on 12/18/2019 at 7:02 pm to provider [facility], who verbally acknowledged these results. Aortic Atherosclerosis (ICD10-I70.0). Electronically Signed   By: Vinnie Langton M.D.   On: 12/18/2019 19:03   DG Chest Portable 1 View  Result Date: 12/18/2019 CLINICAL DATA:  Hypoxia. EXAM: PORTABLE CHEST 1 VIEW COMPARISON:  None. FINDINGS: The heart size and mediastinal contours are within normal limits. Both lungs are clear. The visualized skeletal structures are unremarkable. IMPRESSION: No active disease. Electronically Signed   By: Constance Holster M.D.   On: 12/18/2019 16:00    ASSESSMENT / PLAN:  -Acute hypoxic respiratory failure secondary to submassive pulmonary embolism with evidence of right heart strain -Occlusive to near occlusive thrombus within Left  Peroneal Veins -Mildly elevated troponin, likely demand ischemia (pt without chest pain, no ST changes on  telemetry) -Supplemental O2 as needed to maintain O2 sats greater than 92% -Follow intermittent chest x-ray and ABG as needed -Heparin drip -Vascular surgery consulted for evaluation of thrombectomy, appreciate input -Continuous cardiac monitoring -Maintain MAP greater than 65 -Will hold home antihypertensives for now to avoid hypotension -Trend troponin -Obtain 2D echocardiogram  -Consider Cardiology consult  CKD stage III (baseline Cr. 1.7, GFR 36) ~ appears to be at baseline -Monitor I&O's / urinary output -Follow BMP -Ensure adequate renal perfusion -Avoid nephrotoxic agents as able -Replace electrolytes as indicated -Gentle IV fluids  Diabetes Mellitus Type II -CBG's -SSI -Follow ICU Hypo/hyperglycemia protocol     DISPOSITION: ICU GOALS OF CARE: Full Code VTE PROPHYLAXIS/ANTICOAGULATION: Heparin gtt STRESS ULCER PROPHYLAXIS: Protonix UPDATES:Updated pt at bedside 12/18/19  Darel Hong, AGACNP-BC [facility] Pager: [phone]  12/18/2019, 8:16 PM

## 2019-12-18 NOTE — ED Triage Notes (Addendum)
Pt here via Ems from home today with complaints for shortness of breath and weakness that started this morning. With EMS pt O2 sats were in the 70's, pt was placed on 4L [place] and O2 sats were 84% . Pt now satting at 80% on 4L [place].  Denies CP/ cough/ fever/ nvd.   Pt states that she hasn't eaten or taken her medicine today.

## 2019-12-18 NOTE — ED Provider Notes (Signed)
[facility] Emergency Department Provider Note  ____________________________________________   I have reviewed the triage vital signs and the nursing notes.   HISTORY  Chief Complaint Shortness of Breath and Weakness   History limited by: Not Limited   HPI [name] is a 73 y.o. female who presents to the emergency department today because of shortness of breath.  The patient states she noticed it this morning.  She does take Lasix at night and states she usually goes to bathroom multiple times overnight.  When she went to use the bathroom overnight she became very short of breath.  She denies any associated chest pain.  No cough.  Patient does have a history of CHF although she does not feel fluid overloaded.  She denies any leg swelling.  Patient denies any fevers.  Denies any known sick contacts. When EMS arrived patient had 02 sats in the 70s on room air.   Records reviewed. Per medical record review patient has a history of DM, anemia, HTN.   Past Medical History:  Diagnosis Date  . Adenoma of rectum   . Anemia   . Diabetes mellitus without complication ([place])   . Diverticulosis   . Ganglion cyst    right  . Gout   . Hypertension   . Mixed hyperlipidemia   . Morbid obesity with BMI of 40.0-44.9, adult ([place])   . Venous insufficiency of lower extremity     There are no problems to display for this patient.   Past Surgical History:  Procedure Laterality Date  . ABDOMINAL HYSTERECTOMY    . COLONOSCOPY    . COLONOSCOPY WITH PROPOFOL N/A 11/07/2019   Procedure: COLONOSCOPY WITH PROPOFOL;  Surgeon: Toledo, Benay Pike, MD;  Location: [facility] ENDOSCOPY;  Service: Gastroenterology;  Laterality: N/A;  . DIAGNOSTIC LAPAROSCOPY    . HYSTEROSCOPY WITH D & C    . TUBAL LIGATION      Prior to Admission medications   Medication Sig Start Date End Date Taking? Authorizing Provider  allopurinol (ZYLOPRIM) 100 MG tablet Take 200 mg by mouth daily.    Yes  [provider]  amLODipine (NORVASC) 10 MG tablet Take 10 mg by mouth daily.   Yes [provider]  aspirin EC 81 MG tablet Take 81 mg by mouth daily.   Yes [provider]  Dulaglutide 1.5 MG/0.5ML SOPN Inject 1.5 mg into the skin once a week.   Yes [provider]  fenofibrate 160 MG tablet Take 160 mg by mouth daily.   Yes [provider]  furosemide (LASIX) 40 MG tablet Take 40 mg by mouth.   Yes [provider]  glimepiride (AMARYL) 4 MG tablet Take 4 mg by mouth 2 (two) times daily with a meal.   Yes [provider]  losartan (COZAAR) 100 MG tablet Take 100 mg by mouth daily.   Yes [provider]  lovastatin (MEVACOR) 20 MG tablet Take 20 mg by mouth daily.   Yes [provider]  metoprolol succinate (TOPROL-XL) 100 MG 24 hr tablet Take 100 mg by mouth daily. Take with or immediately following a meal.   Yes [provider]  naproxen sodium (ALEVE) 220 MG tablet Take 220 mg by mouth daily as needed (pain).    Yes [provider]  Omega-3 1000 MG CAPS Take 1,000 mg by mouth 2 (two) times daily.   Yes [provider]    Allergies Metformin and related and Sulfa antibiotics  Family History  Problem Relation Age of Onset  . Breast cancer Neg Hx     Social History Social History   Tobacco Use  . Smoking status: Never Smoker  . Smokeless tobacco: Never Used  Substance Use Topics  . Alcohol use: Not Currently  . Drug use: Never    Review of Systems Constitutional: No fever/chills Eyes: No visual changes. ENT: No sore throat. Cardiovascular: Denies chest pain. Respiratory: Positive for shortness of breath. Gastrointestinal: No abdominal pain.  No nausea, no vomiting.  No diarrhea.   Genitourinary: Negative for dysuria. Musculoskeletal: Negative for back pain. Skin: Negative for rash. Neurological: Negative for headaches, focal weakness or  numbness.  ____________________________________________   PHYSICAL EXAM:  VITAL SIGNS: ED Triage Vitals  Enc Vitals Group     BP 12/18/19 1439 (!) 150/76     Pulse Rate 12/18/19 1439 82     Resp 12/18/19 1439 15     Temp 12/18/19 1439 98.4 F (36.9 C)     Temp Source 12/18/19 1439 Oral     SpO2 12/18/19 1439 (!) 80 %     Weight 12/18/19 1440 220 lb (99.8 kg)     Height 12/18/19 1440 5\' 2"  (1.575 m)     Head Circumference --      Peak Flow --      Pain Score 12/18/19 1440 0   Constitutional: Alert and oriented.  Eyes: Conjunctivae are normal.  ENT      Head: Normocephalic and atraumatic.      Nose: No congestion/rhinnorhea.      Mouth/Throat: Mucous membranes are moist.      Neck: No stridor. Hematological/Lymphatic/Immunilogical: No cervical lymphadenopathy. Cardiovascular: Normal rate, regular rhythm.  No murmurs, rubs, or gallops.  Respiratory: Normal respiratory effort without tachypnea nor retractions. Breath sounds are clear and equal bilaterally. No wheezes/rales/rhonchi. Gastrointestinal: Soft and non tender. No rebound. No guarding.  Genitourinary: Deferred Musculoskeletal: Normal range of motion in all extremities. No lower extremity edema. Neurologic:  Normal speech and language. No gross focal neurologic deficits are appreciated.  Skin:  Skin is warm, dry and intact. No rash noted. Psychiatric: Mood and affect are normal. Speech and behavior are normal. Patient exhibits appropriate insight and judgment.  ____________________________________________    LABS (pertinent positives/negatives)  CBC wbc 7.5, hgb 12.5, plt 174 CMP wnl except glu 233, bun 29, cr 1.38 COVID negative BNP 80.0 Procalcitonin <.10 Trop hs 64 ____________________________________________   EKG  I, Nance Pear, attending physician, personally viewed and interpreted this EKG  EKG Time: 1440 Rate: 81 Rhythm: sinus rhythm Axis: left axis deviation Intervals: qtc 475 QRS:  IVCD ST changes: no st elevation Impression: abnormal ekg  ____________________________________________    RADIOLOGY  CXR No acute abnormality  ____________________________________________   PROCEDURES  Procedures  CRITICAL CARE Performed by: Nance Pear   Total critical care time: 35 minutes  Critical care time was exclusive of separately billable procedures and treating other patients.  Critical care was necessary to treat or prevent imminent or life-threatening deterioration.  Critical care was time spent personally by me on the following activities: development of treatment plan with patient and/or surrogate as well as nursing, discussions with consultants, evaluation of patient's response to treatment, examination of patient, obtaining history from patient or surrogate, ordering and performing treatments and interventions, ordering and review of laboratory studies, ordering and review of radiographic studies, pulse oximetry and re-evaluation of patient's condition.  ____________________________________________   INITIAL IMPRESSION / ASSESSMENT AND PLAN / ED COURSE  Pertinent labs &  imaging results that were available during my care of the patient were reviewed by me and considered in my medical decision making (see chart for details).   Patient presented to the emergency department today because of concerns for shortness of breath.  Patient was found to be profoundly hypoxic by EMS.  Chest x-ray without concerning pneumonia or pneumothorax.  Patient was Covid negative.  To continue work-up of hypoxia CT angiogram was obtained of her chest.  Per my read this showed multiple bilateral PEs.  She also had a mildly elevated troponin.  Discussed with Dr. Lucky Cowboy with vascular surgery.  At this point will place patient on heparin.  Will reassess for thrombectomy tomorrow morning.  Discussed findings and plan for admission with  patient.  ____________________________________________   FINAL CLINICAL IMPRESSION(S) / ED DIAGNOSES  Final diagnoses:  Shortness of breath  Hypoxia  Pulmonary embolism, other, unspecified chronicity, unspecified whether acute cor pulmonale present [facility])     Note: This dictation was prepared with Dragon dictation. Any transcriptional errors that result from this process are unintentional     Nance Pear, MD 12/18/19 1945

## 2019-12-18 NOTE — ED Notes (Signed)
Pt is resting in no acute distress.  She is not expressing any needs at this time. Bed is locked in lowest position and call bell is within reach.

## 2019-12-19 ENCOUNTER — Inpatient Hospital Stay (HOSPITAL_COMMUNITY)
Admit: 2019-12-19 | Discharge: 2019-12-19 | Disposition: A | Discharge status: Home / Self Care | Payer: Medicare Other | Attending: Pulmonary Disease | Admitting: Pulmonary Disease

## 2019-12-19 DIAGNOSIS — I2699 Other pulmonary embolism without acute cor pulmonale: Principal | ICD-10-CM

## 2019-12-19 DIAGNOSIS — I2609 Other pulmonary embolism with acute cor pulmonale: Secondary | ICD-10-CM

## 2019-12-19 LAB — CBC
HCT: 35.6 % — ABNORMAL LOW (ref 36.0–46.0)
Hemoglobin: 11.2 g/dL — ABNORMAL LOW (ref 12.0–15.0)
MCH: 26.7 pg (ref 26.0–34.0)
MCHC: 31.5 g/dL (ref 30.0–36.0)
MCV: 84.8 fL (ref 80.0–100.0)
Platelets: 158 10*3/uL (ref 150–400)
RBC: 4.2 MIL/uL (ref 3.87–5.11)
RDW: 13.2 % (ref 11.5–15.5)
WBC: 7.2 10*3/uL (ref 4.0–10.5)
nRBC: 0 % (ref 0.0–0.2)

## 2019-12-19 LAB — BASIC METABOLIC PANEL
Anion gap: 11 (ref 5–15)
BUN: 26 mg/dL — ABNORMAL HIGH (ref 8–23)
CO2: 27 mmol/L (ref 22–32)
Calcium: 9.6 mg/dL (ref 8.9–10.3)
Chloride: 106 mmol/L (ref 98–111)
Creatinine, Ser: 1.15 mg/dL — ABNORMAL HIGH (ref 0.44–1.00)
GFR calc Af Amer: 54 mL/min — ABNORMAL LOW (ref >60)
GFR calc non Af Amer: 47 mL/min — ABNORMAL LOW (ref >60)
Glucose, Bld: 121 mg/dL — ABNORMAL HIGH (ref 70–99)
Potassium: 3.8 mmol/L (ref 3.5–5.1)
Sodium: 144 mmol/L (ref 135–145)

## 2019-12-19 LAB — GLUCOSE, CAPILLARY
Glucose-Capillary: 114 mg/dL — ABNORMAL HIGH (ref 70–99)
Glucose-Capillary: 111 mg/dL — ABNORMAL HIGH (ref 70–99)
Glucose-Capillary: 113 mg/dL — ABNORMAL HIGH (ref 70–99)
Glucose-Capillary: 166 mg/dL — ABNORMAL HIGH (ref 70–99)
Glucose-Capillary: 137 mg/dL — ABNORMAL HIGH (ref 70–99)

## 2019-12-19 LAB — HEPARIN LEVEL (UNFRACTIONATED)
Heparin Unfractionated: 1.28 IU/mL — ABNORMAL HIGH (ref 0.30–0.70)
Heparin Unfractionated: 0.75 IU/mL — ABNORMAL HIGH (ref 0.30–0.70)
Heparin Unfractionated: 0.88 IU/mL — ABNORMAL HIGH (ref 0.30–0.70)

## 2019-12-19 LAB — TROPONIN I (HIGH SENSITIVITY)
Troponin I (High Sensitivity): 418 ng/L (ref <18)
Troponin I (High Sensitivity): 418 ng/L (ref <18)

## 2019-12-19 LAB — HEMOGLOBIN A1C
Hgb A1c MFr Bld: 6.9 % — ABNORMAL HIGH (ref 4.8–5.6)
Mean Plasma Glucose: 151.33 mg/dL

## 2019-12-19 LAB — ECHOCARDIOGRAM COMPLETE
Height: 62 in
Weight: 3403.9 oz

## 2019-12-19 LAB — BRAIN NATRIURETIC PEPTIDE: B Natriuretic Peptide: 235 pg/mL — ABNORMAL HIGH (ref 0.0–100.0)

## 2019-12-19 MED ORDER — HEPARIN (PORCINE) 25000 UT/250ML-% IV SOLN
850.0000 [IU]/h | INTRAVENOUS | Status: DC
  Administered 2019-12-19: 950 [IU]/h via INTRAVENOUS
  Filled 2019-12-19: qty 250

## 2019-12-19 MED ORDER — PRAVASTATIN SODIUM 20 MG PO TABS
20.0000 mg | ORAL_TABLET | Freq: Every day | ORAL | Status: DC
  Administered 2019-12-19 – 2019-12-20 (×2): 20 mg via ORAL
  Filled 2019-12-19 (×2): qty 1

## 2019-12-19 MED ORDER — LACTATED RINGERS IV SOLN: INTRAVENOUS | Status: DC

## 2019-12-19 MED ORDER — PANTOPRAZOLE SODIUM 40 MG PO TBEC
40.0000 mg | DELAYED_RELEASE_TABLET | Freq: Every day | ORAL | Status: DC
  Administered 2019-12-19 – 2019-12-20 (×2): 40 mg via ORAL
  Filled 2019-12-19 (×3): qty 1

## 2019-12-19 MED ORDER — ALLOPURINOL 100 MG PO TABS
100.0000 mg | ORAL_TABLET | Freq: Every day | ORAL | Status: DC
  Administered 2019-12-19 – 2019-12-21 (×3): 100 mg via ORAL
  Filled 2019-12-19 (×3): qty 1

## 2019-12-19 MED ORDER — ASPIRIN EC 81 MG PO TBEC
81.0000 mg | DELAYED_RELEASE_TABLET | Freq: Every day | ORAL | Status: DC
  Administered 2019-12-19 – 2019-12-20 (×2): 81 mg via ORAL
  Filled 2019-12-19 (×3): qty 1

## 2019-12-19 MED ORDER — OMEGA-3-ACID ETHYL ESTERS 1 G PO CAPS
1000.0000 mg | ORAL_CAPSULE | Freq: Two times a day (BID) | ORAL | Status: DC
  Administered 2019-12-19 – 2019-12-21 (×5): 1000 mg via ORAL
  Filled 2019-12-19 (×5): qty 1

## 2019-12-19 MED ORDER — FENOFIBRATE 160 MG PO TABS
160.0000 mg | ORAL_TABLET | Freq: Every day | ORAL | Status: DC
  Administered 2019-12-19 – 2019-12-21 (×3): 160 mg via ORAL
  Filled 2019-12-19 (×3): qty 1

## 2019-12-19 NOTE — Progress Notes (Signed)
ANTICOAGULATION CONSULT NOTE - Initial Consult  Pharmacy Consult for Heparin Drip Indication: pulmonary embolus  Allergies  Allergen Reactions  . Metformin And Related   . Sulfa Antibiotics     Patient Measurements: Height: 5\' 2"  (157.5 cm) Weight: 212 lb 11.9 oz (96.5 kg) IBW/kg (Calculated) : 50.1 Heparin Dosing Weight: 73.8 kg  Vital Signs: Temp: 98.6 F (37 C) (12/31 1200) Temp Source: Oral (12/31 1200) BP: 150/78 (12/31 1300) Pulse Rate: 71 (12/31 1300)  Labs: Recent Labs    12/18/19 1451 12/18/19 1907 12/19/19 0034 12/19/19 0241 12/19/19 1459  HGB 12.5  --   --  11.2*  --   HCT 38.5  --   --  35.6*  --   PLT 174  --   --  158  --   APTT  --  29  --   --   --   LABPROT  --  13.1  --   --   --   INR  --  1.0  --   --   --   HEPARINUNFRC  --   --   --  1.28* 0.75*  CREATININE 1.38*  --   --  1.15*  --   TROPONINIHS 64*  --  418* 418*  --     Estimated Creatinine Clearance: 46.5 mL/min (A) (by C-G formula based on SCr of 1.15 mg/dL (H)).   Medical History: Past Medical History:  Diagnosis Date  . Adenoma of rectum   . Anemia   . Diabetes mellitus without complication ([place])   . Diverticulosis   . Ganglion cyst    right  . Gout   . Hypertension   . Mixed hyperlipidemia   . Morbid obesity with BMI of 40.0-44.9, adult ([place])   . Venous insufficiency of lower extremity     Assessment: Patient is a 73yo female admitted for PE. Pharmacy consulted for Heparin dosing.  Goal of Therapy:  Heparin level 0.3-0.7 units/ml Monitor platelets by anticoagulation protocol: Yes   Plan:  12/31 @ 1459 HL 0.75 supratherapeutic. Will lower rate to 850 units/hr and plan to recheck 8-hour Anti-Xa @ 2300.  CBC trended slightly, will continue to monitor.  Gerald Dexter, PharmD Pharmacy Resident  12/19/2019 3:28 PM

## 2019-12-19 NOTE — Progress Notes (Signed)
ANTICOAGULATION CONSULT NOTE - Initial Consult  Pharmacy Consult for Heparin Drip Indication: pulmonary embolus  Allergies  Allergen Reactions  . Metformin And Related   . Sulfa Antibiotics     Patient Measurements: Height: 5\' 2"  (157.5 cm) Weight: 212 lb 11.9 oz (96.5 kg) IBW/kg (Calculated) : 50.1 Heparin Dosing Weight: 73.8 kg  Vital Signs: Temp: 98 F (36.7 C) (12/31 0400) Temp Source: Oral (12/31 0400) BP: 147/74 (12/31 0500) Pulse Rate: 72 (12/31 0500)  Labs: Recent Labs    12/18/19 1451 12/18/19 1907 12/19/19 0034 12/19/19 0241  HGB 12.5  --   --  11.2*  HCT 38.5  --   --  35.6*  PLT 174  --   --  158  APTT  --  29  --   --   LABPROT  --  13.1  --   --   INR  --  1.0  --   --   HEPARINUNFRC  --   --   --  1.28*  CREATININE 1.38*  --   --  1.15*  TROPONINIHS 64*  --  418* 418*    Estimated Creatinine Clearance: 46.5 mL/min (A) (by C-G formula based on SCr of 1.15 mg/dL (H)).   Medical History: Past Medical History:  Diagnosis Date  . Adenoma of rectum   . Anemia   . Diabetes mellitus without complication ([place])   . Diverticulosis   . Ganglion cyst    right  . Gout   . Hypertension   . Mixed hyperlipidemia   . Morbid obesity with BMI of 40.0-44.9, adult ([place])   . Venous insufficiency of lower extremity     Assessment: Patient is a 73yo female admitted for PE. Pharmacy consulted for Heparin dosing.  Goal of Therapy:  Heparin level 0.3-0.7 units/ml Monitor platelets by anticoagulation protocol: Yes   Plan:  12/31 @ 0300 HL 1.28 supratherapeutic. Will hold drip for 1 hour and restart at 950 units/hr and plan to recheck HL @ 1500; however, patient may go for thrombectomy, CBC trended slightly, will continue to monitor.  Tobie Lords, PharmD, BCPS Clinical Pharmacist 12/19/2019 5:48 AM

## 2019-12-19 NOTE — Progress Notes (Signed)
*  PRELIMINARY RESULTS* Echocardiogram 2D Echocardiogram has been performed.  Sherrie Sport 12/19/2019, 1:06 PM

## 2019-12-19 NOTE — Consult Note (Signed)
Name: Crystal Terrell MRN: [id number] DOB: March 18, 1946    ADMISSION DATE:  12/18/2019 CONSULTATION DATE:  12/18/2019  REFERRING MD :  Dr. Archie Balboa  CHIEF COMPLAINT:  Shortness of breath  BRIEF PATIENT DESCRIPTION:  73 y.o. Female admitted 12/18/19 with bilateral Acute submassive PE with evidence of right heart strain requiring Heparin drip.  BP currently stable.  Vascular surgery consulted for possible Thrombectomy.  Found to have occlusive to near occlusive thrombus in the left peroneal veins.  SIGNIFICANT EVENTS  12/30>> Admission to ICU 12/31- patient has improved overnight, weaned to 2L/min nasal canula. Diet advanced, optimizing for transfer.   STUDIES:  CTA Chest 12/30>>1. Positive for acute PE with CT evidence of right heart strain (RV/LV Ratio = 1.79) consistent with at least submassive (intermediate risk) PE. The presence of right heart strain has been associated with an increased risk of morbidity and mortality. Please activate Code PE by paging [phone]. Numerous filling defects throughout the pulmonary arterial tree bilaterally involving the distal left main pulmonary artery as well as lobar, segmental and subsegmental sized branches to the lungs bilaterally, most of which appear nonocclusive. Overall clot burden is very high. Heart size is borderline enlarged. Right ventricle appears dilated compared with the (5.2 cm versus 2.9 cm). There is no significant pericardial fluid, thickening or pericardial calcification. Aortic atherosclerosis. No definite coronary artery calcifications. 2. Hepatic steatosis. 3. Aortic atherosclerosis.  Venous US bilateral LE 12/30>> Occlusive to near occlusive thrombus within the left peroneal veins. No DVT on the right. 2D Echocardiogram 12/31>>  CULTURES: SARS-CoV-2 PCR 12/30>> negative Influenza PCR 12/30>> negative  ANTIBIOTICS: N/A  HISTORY OF PRESENT ILLNESS:   Crystal Terrell is a 73 year old female with a past medical  history notable for CKD Stage III (baseline Cr. 1.7), diabetes mellitus, hypertension, hyperlipidemia, morbid obesity, chronic venous insufficiency, adenoma of the rectum, anemia, diverticulosis who [facility] to [facility] ED on 12/18/2019 with complaints of shortness of breath.  She reports the shortness of breath began earlier this morning.  She denies any associated cough, hemoptysis, chest pain, lower extremity edema, nausea, vomiting, fever, chills, abdominal pain, or sick contacts.  Upon presentation to the ED she was noted to be hypoxic with O2 sats in the 70s on room air.  Initial work-up in the ED revealed creatinine 1.38, glucose 233, BNP 80, high-sensitivity troponin 64, negative procalcitonin.  Both flu and COVID-19 PCR's are negative.  Chest x-ray was negative for any acute disease process.  CTA chest revealed bilateral submassive PE with right heart strain.  Blood pressures are currently stable.  ED physician discussed with vascular surgery, who recommended placing patient on heparin drip, with reassessment tomorrow for thrombectomy.  PCCM is asked to admit the patient for acute hypoxic respiratory failure in the setting of bilateral acute submassive pulmonary embolism with right heart strain.    She denies any provoking factors including: Recent surgery, trauma, immobility, hormone therapy, or active cancer.  She does exhibit morbid obesity.  She denies smoking.  PAST MEDICAL HISTORY :   has a past medical history of Adenoma of rectum, Anemia, Diabetes mellitus without complication ([place]), Diverticulosis, Ganglion cyst, Gout, Hypertension, Mixed hyperlipidemia, Morbid obesity with BMI of 40.0-44.9, adult ([place]), and Venous insufficiency of lower extremity.  has a past surgical history that includes Colonoscopy; Abdominal hysterectomy; Hysteroscopy with D & C; Diagnostic laparoscopy; Tubal ligation; and Colonoscopy with propofol (N/A, 11/07/2019). Prior to Admission medications   Medication Sig Start  Date End Date Taking? Authorizing Provider  allopurinol (ZYLOPRIM) 100  MG tablet Take 200 mg by mouth daily.    Yes [provider]  amLODipine (NORVASC) 10 MG tablet Take 10 mg by mouth daily.   Yes [provider]  aspirin EC 81 MG tablet Take 81 mg by mouth daily.   Yes [provider]  Dulaglutide 1.5 MG/0.5ML SOPN Inject 1.5 mg into the skin once a week.   Yes [provider]  fenofibrate 160 MG tablet Take 160 mg by mouth daily.   Yes [provider]  furosemide (LASIX) 40 MG tablet Take 40 mg by mouth.   Yes [provider]  glimepiride (AMARYL) 4 MG tablet Take 4 mg by mouth 2 (two) times daily with a meal.   Yes [provider]  losartan (COZAAR) 100 MG tablet Take 100 mg by mouth daily.   Yes [provider]  lovastatin (MEVACOR) 20 MG tablet Take 20 mg by mouth daily.   Yes [provider]  metoprolol succinate (TOPROL-XL) 100 MG 24 hr tablet Take 100 mg by mouth daily. Take with or immediately following a meal.   Yes [provider]  naproxen sodium (ALEVE) 220 MG tablet Take 220 mg by mouth daily as needed (pain).    Yes [provider]  Omega-3 1000 MG CAPS Take 1,000 mg by mouth 2 (two) times daily.   Yes [provider]   Allergies  Allergen Reactions  . Metformin And Related   . Sulfa Antibiotics     FAMILY HISTORY:  family history is not on file. SOCIAL HISTORY:  reports that she has never smoked. She has never used smokeless tobacco. She reports previous alcohol use. She reports that she does not use drugs.   COVID-19 DISASTER DECLARATION:  FULL CONTACT PHYSICAL EXAMINATION WAS NOT POSSIBLE DUE TO TREATMENT OF COVID-19 AND  CONSERVATION OF PERSONAL PROTECTIVE EQUIPMENT, LIMITED EXAM FINDINGS INCLUDE-  Patient assessed or the symptoms described in the history of present illness.  In the context of the Global COVID-19 pandemic, which necessitated consideration  that the patient might be at risk for infection with the SARS-CoV-2 virus that causes COVID-19, Institutional protocols and algorithms that pertain to the evaluation of patients at risk for COVID-19 are in a state of rapid change based on information released by regulatory bodies including the CDC and federal and state organizations. These policies and algorithms were followed during the patient's care while in hospital.  REVIEW OF SYSTEMS: Positives in bold Constitutional: Negative for fever, chills, weight loss, malaise/fatigue and diaphoresis.  HENT: Negative for hearing loss, ear pain, nosebleeds, congestion, sore throat, neck pain, tinnitus and ear discharge.   Eyes: Negative for blurred vision, double vision, photophobia, pain, discharge and redness.  Respiratory: Negative for cough, hemoptysis, sputum production, shortness of breath,+dysnpnea with exertion, wheezing and stridor.   Cardiovascular: Negative for chest pain, palpitations, orthopnea, claudication, leg swelling and PND.  Gastrointestinal: Negative for heartburn, nausea, vomiting, abdominal pain, diarrhea, constipation, blood in stool and melena.  Genitourinary: Negative for dysuria, urgency, frequency, hematuria and flank pain.  Musculoskeletal: Negative for myalgias, back pain, joint pain and falls.  Skin: Negative for itching and rash.  Neurological: Negative for dizziness, tingling, tremors, sensory change, speech change, focal weakness, seizures, loss of consciousness, weakness and headaches.  Endo/Heme/Allergies: Negative for environmental allergies and polydipsia. Does not bruise/bleed easily.  SUBJECTIVE:  Pt reports dyspnea with exertion Denies SOB at rest, cough, sputum, chest pain, palpitations, N/V, abdominal pain, fever, chills, or sick contacts On NRB mask  VITAL  SIGNS: Temp:  [97.2 F (36.2 C)-98.4 F (36.9 C)] 98.1 F (36.7 C) (12/31 0800) Pulse Rate:  [70-96] 75 (12/31 0900) Resp:  [10-26] 14 (12/31  0900) BP: (117-152)/(68-86) 152/69 (12/31 0900) SpO2:  [80 %-100 %] 97 % (12/31 0900) Weight:  [96.5 kg-99.8 kg] 96.5 kg (12/31 0500)  PHYSICAL EXAMINATION: General:  Acute on chronically ill appearing female, sitting on bed, on NRB mask, in No acute distress Neuro:  Awake, A&O x4, follows commands, no focal deficits, speech clear HEENT:  Atraumatic, normocephalic, neck supple, no JVD Cardiovascular:  Regular rate & rhythm, s1s2 noted, no M/R/G, 2+ pulses throughout Lungs:  Clear to auscultation bilaterally, no wheezing, even, nonlabored, normal effort Abdomen:  Obese, soft, nontender,nondistended, no guarding or rebound tenderness, BS+ x4 Musculoskeletal:  Normal bulk and tone, no deformities, no edema Skin:  Warm and dry.  No obvious rashes, lesions, or ulcerations  Recent Labs  Lab 12/18/19 1451 12/19/19 0241  NA 141 144  K 3.7 3.8  CL 101 106  CO2 26 27  BUN 29* 26*  CREATININE 1.38* 1.15*  GLUCOSE 233* 121*   Recent Labs  Lab 12/18/19 1451 12/19/19 0241  HGB 12.5 11.2*  HCT 38.5 35.6*  WBC 7.5 7.2  PLT 174 158   CT Angio Chest PE W and/or Wo Contrast  Result Date: 12/18/2019 CLINICAL DATA:  73 year old female with history of hypoxia. Shortness of breath. Weakness. EXAM: CT ANGIOGRAPHY CHEST WITH CONTRAST TECHNIQUE: Multidetector CT imaging of the chest was performed using the standard protocol during bolus administration of intravenous contrast. Multiplanar CT image reconstructions and MIPs were obtained to evaluate the vascular anatomy. CONTRAST:  43mL OMNIPAQUE IOHEXOL 350 MG/ML SOLN COMPARISON:  No priors. FINDINGS: Cardiovascular: Numerous filling defects throughout the pulmonary arterial tree bilaterally involving the distal left main pulmonary artery as well as lobar, segmental and subsegmental sized branches to the lungs bilaterally, most of which appear nonocclusive. Overall clot burden is very high. Heart size is borderline enlarged. Right ventricle appears  dilated compared with the (5.2 cm versus 2.9 cm). There is no significant pericardial fluid, thickening or pericardial calcification. Aortic atherosclerosis. No definite coronary artery calcifications. Mediastinum/Nodes: No pathologically enlarged mediastinal or hilar lymph nodes. Esophagus is unremarkable in appearance. No axillary lymphadenopathy. Lungs/Pleura: No suspicious appearing pulmonary nodules or masses are noted. No acute consolidative airspace disease. No pleural effusions. Upper Abdomen: Diffuse low attenuation throughout the visualized hepatic parenchyma, indicative of hepatic steatosis. Musculoskeletal: There are no aggressive appearing lytic or blastic lesions noted in the visualized portions of the skeleton. Review of the MIP images confirms the above findings. IMPRESSION: 1. Positive for acute PE with CT evidence of right heart strain (RV/LV Ratio = 1.79) consistent with at least submassive (intermediate risk) PE. The presence of right heart strain has been associated with an increased risk of morbidity and mortality. Please activate Code PE by paging [phone]. 2. Hepatic steatosis. 3. Aortic atherosclerosis. Critical Value/emergent results were called by telephone at the time of interpretation on 12/18/2019 at 7:02 pm to provider [facility], who verbally acknowledged these results. Aortic Atherosclerosis (ICD10-I70.0). Electronically Signed   By: Vinnie Langton M.D.   On: 12/18/2019 19:03   US Venous Img Lower Bilateral (DVT)  Result Date: 12/18/2019 CLINICAL DATA:  PE. EXAM: BILATERAL LOWER EXTREMITY VENOUS DOPPLER ULTRASOUND TECHNIQUE: Gray-scale sonography with graded compression, as well as color Doppler and duplex ultrasound were performed to evaluate the lower extremity deep venous systems from the level of the common femoral vein and  including the common femoral, femoral, profunda femoral, popliteal and calf veins including the posterior tibial, peroneal and gastrocnemius  veins when visible. The superficial great saphenous vein was also interrogated. Spectral Doppler was utilized to evaluate flow at rest and with distal augmentation maneuvers in the common femoral, femoral and popliteal veins. COMPARISON:  None. FINDINGS: RIGHT LOWER EXTREMITY Common Femoral Vein: No evidence of thrombus. Normal compressibility, respiratory phasicity and response to augmentation. Saphenofemoral Junction: No evidence of thrombus. Normal compressibility and flow on color Doppler imaging. Profunda Femoral Vein: No evidence of thrombus. Normal compressibility and flow on color Doppler imaging. Femoral Vein: No evidence of thrombus. Normal compressibility, respiratory phasicity and response to augmentation. Popliteal Vein: No evidence of thrombus. Normal compressibility, respiratory phasicity and response to augmentation. Calf Veins: No evidence of thrombus. Normal compressibility and flow on color Doppler imaging. Superficial Great Saphenous Vein: No evidence of thrombus. Normal compressibility. Venous Reflux:  None. Other Findings:  None. LEFT LOWER EXTREMITY Common Femoral Vein: No evidence of thrombus. Normal compressibility, respiratory phasicity and response to augmentation. Saphenofemoral Junction: No evidence of thrombus. Normal compressibility and flow on color Doppler imaging. Profunda Femoral Vein: No evidence of thrombus. Normal compressibility and flow on color Doppler imaging. Femoral Vein: No evidence of thrombus. Normal compressibility, respiratory phasicity and response to augmentation. Popliteal Vein: No evidence of thrombus. Normal compressibility, respiratory phasicity and response to augmentation. Calf Veins: There is occlusive to near occlusive thrombus within the paired left peroneal veins. Superficial Great Saphenous Vein: No evidence of thrombus. Normal compressibility. Venous Reflux:  None. Other Findings:  None. IMPRESSION: Occlusive to near occlusive thrombus within the left  peroneal veins. No DVT on the right. Electronically Signed   By: Constance Holster M.D.   On: 12/18/2019 21:43   DG Chest Portable 1 View  Result Date: 12/18/2019 CLINICAL DATA:  Hypoxia. EXAM: PORTABLE CHEST 1 VIEW COMPARISON:  None. FINDINGS: The heart size and mediastinal contours are within normal limits. Both lungs are clear. The visualized skeletal structures are unremarkable. IMPRESSION: No active disease. Electronically Signed   By: Constance Holster M.D.   On: 12/18/2019 16:00    ASSESSMENT / PLAN:  -Acute hypoxic respiratory failure secondary to submassive pulmonary embolism with evidence of right heart strain -Occlusive to near occlusive thrombus within Left Peroneal Veins -Mildly elevated troponin, likely demand ischemia (pt without chest pain, no ST changes on telemetry) -Supplemental O2 as needed to maintain O2 sats greater than 92% -Follow intermittent chest x-ray and ABG as needed -Heparin drip -Vascular surgery consulted for evaluation of thrombectomy, appreciate input -Continuous cardiac monitoring -Maintain MAP greater than 65 -Will hold home antihypertensives for now to avoid hypotension -Trend troponin -Obtain 2D echocardiogram  -Consider Cardiology consult  CKD stage III (baseline Cr. 1.7, GFR 36) ~ appears to be at baseline -Monitor I&O's / urinary output -Follow BMP -Ensure adequate renal perfusion -Avoid nephrotoxic agents as able -Replace electrolytes as indicated -Gentle IV fluids  Diabetes Mellitus Type II -CBG's -SSI -Follow ICU Hypo/hyperglycemia protocol     DISPOSITION: ICU GOALS OF CARE: Full Code VTE PROPHYLAXIS/ANTICOAGULATION: Heparin gtt STRESS ULCER PROPHYLAXIS: Protonix UPDATES:Updated pt at bedside 12/18/19   Critical care provider statement:    Critical care time (minutes):  32   Critical care time was exclusive of:  Separately billable procedures and  treating other patients   Critical care was necessary to treat or  prevent imminent or  life-threatening deterioration of the following conditions:  pulmonary embolism with cardiac strain, acute hypoxemic respiratory  failure   Critical care was time spent personally by me on the following  activities:  Development of treatment plan with patient or surrogate,  discussions with consultants, evaluation of patient's response to  treatment, examination of patient, obtaining history from patient or  surrogate, ordering and performing treatments and interventions, ordering  and review of laboratory studies and re-evaluation of patient's condition   I assumed direction of critical care for this patient from another  provider in my specialty: no      Ottie Glazier, M.D.  [facility] [place]    12/19/2019, 10:16 AM

## 2019-12-19 NOTE — TOC Progression Note (Signed)
Transition of Care [facility]) - Progression Note    Patient Details  Name: Crystal Terrell MRN: [id number] Date of Birth: [birth year]  Transition of Care [facility]) CM/SW [place], RN Phone Number: 12/19/2019, 11:18 AM  Clinical Narrative:    Patient remains in ICU with a PE, still on heparin drip and O2, CMRN will continue to monitor for needs        Expected Discharge Plan and Services                                                 Social Determinants of Health (SDOH) Interventions    Readmission Risk Interventions No flowsheet data found.

## 2019-12-19 NOTE — Progress Notes (Signed)
PHARMACIST - PHYSICIAN COMMUNICATION   CONCERNING: IV to Oral Route Change Policy  RECOMMENDATION: This patient is receiving pantoprazole by the intravenous route.  Based on criteria approved by the Pharmacy and Therapeutics Committee, the intravenous medication(s) is/are being converted to the equivalent oral dose form(s).   DESCRIPTION: These criteria include:  The patient is eating (either orally or via tube) and/or has been taking other orally administered medications for a least 24 hours  The patient has no evidence of active gastrointestinal bleeding or impaired GI absorption (gastrectomy, short bowel, patient on TNA or NPO).  If you have questions about this conversion, please contact the [place], PharmD 12/19/2019 12:57 PM

## 2019-12-20 DIAGNOSIS — R0902 Hypoxemia: Secondary | ICD-10-CM

## 2019-12-20 DIAGNOSIS — E119 Type 2 diabetes mellitus without complications: Secondary | ICD-10-CM

## 2019-12-20 DIAGNOSIS — E785 Hyperlipidemia, unspecified: Secondary | ICD-10-CM

## 2019-12-20 DIAGNOSIS — I82452 Acute embolism and thrombosis of left peroneal vein: Secondary | ICD-10-CM

## 2019-12-20 LAB — GLUCOSE, CAPILLARY
Glucose-Capillary: 110 mg/dL — ABNORMAL HIGH (ref 70–99)
Glucose-Capillary: 114 mg/dL — ABNORMAL HIGH (ref 70–99)
Glucose-Capillary: 127 mg/dL — ABNORMAL HIGH (ref 70–99)
Glucose-Capillary: 137 mg/dL — ABNORMAL HIGH (ref 70–99)
Glucose-Capillary: 179 mg/dL — ABNORMAL HIGH (ref 70–99)
Glucose-Capillary: 182 mg/dL — ABNORMAL HIGH (ref 70–99)

## 2019-12-20 LAB — HEPARIN LEVEL (UNFRACTIONATED): Heparin Unfractionated: 1.05 IU/mL — ABNORMAL HIGH (ref 0.30–0.70)

## 2019-12-20 MED ORDER — APIXABAN 5 MG PO TABS: 5.0000 mg | ORAL_TABLET | Freq: Two times a day (BID) | ORAL | Status: DC

## 2019-12-20 MED ORDER — AMLODIPINE BESYLATE 10 MG PO TABS
10.0000 mg | ORAL_TABLET | Freq: Every day | ORAL | Status: DC
  Administered 2019-12-20 – 2019-12-21 (×2): 10 mg via ORAL
  Filled 2019-12-20 (×2): qty 1

## 2019-12-20 MED ORDER — FUROSEMIDE 40 MG PO TABS
40.0000 mg | ORAL_TABLET | Freq: Every day | ORAL | Status: DC
  Administered 2019-12-21: 10:00:00 40 mg via ORAL
  Filled 2019-12-20: qty 1

## 2019-12-20 MED ORDER — HEPARIN (PORCINE) 25000 UT/250ML-% IV SOLN
650.0000 [IU]/h | INTRAVENOUS | Status: DC
  Administered 2019-12-20: 12:00:00 650 [IU]/h via INTRAVENOUS

## 2019-12-20 MED ORDER — APIXABAN 5 MG PO TABS
10.0000 mg | ORAL_TABLET | Freq: Two times a day (BID) | ORAL | Status: DC
  Administered 2019-12-20 – 2019-12-21 (×3): 10 mg via ORAL
  Filled 2019-12-20 (×3): qty 2

## 2019-12-20 NOTE — Progress Notes (Signed)
[place] for Heparin Drip Indication: pulmonary embolus  Allergies  Allergen Reactions  . Metformin And Related   . Sulfa Antibiotics     Patient Measurements: Height: 5\' 2"  (157.5 cm) Weight: 212 lb 11.9 oz (96.5 kg) IBW/kg (Calculated) : 50.1 Heparin Dosing Weight: 73.8 kg  Vital Signs: Temp: 99 F (37.2 C) (01/01 0424) BP: 149/70 (01/01 0424) Pulse Rate: 78 (01/01 0424)  Labs: Recent Labs    12/18/19 1451 12/18/19 1907 12/19/19 0034 12/19/19 0241 12/19/19 1459 12/19/19 2318 12/20/19 1009  HGB 12.5  --   --  11.2*  --   --   --   HCT 38.5  --   --  35.6*  --   --   --   PLT 174  --   --  158  --   --   --   APTT  --  29  --   --   --   --   --   LABPROT  --  13.1  --   --   --   --   --   INR  --  1.0  --   --   --   --   --   HEPARINUNFRC  --   --   --  1.28* 0.75* 0.88* 1.05*  CREATININE 1.38*  --   --  1.15*  --   --   --   TROPONINIHS 64*  --  418* 418*  --   --   --     Estimated Creatinine Clearance: 46.5 mL/min (A) (by C-G formula based on SCr of 1.15 mg/dL (H)).   Medical History: Past Medical History:  Diagnosis Date  . Adenoma of rectum   . Anemia   . Diabetes mellitus without complication ([place])   . Diverticulosis   . Ganglion cyst    right  . Gout   . Hypertension   . Mixed hyperlipidemia   . Morbid obesity with BMI of 40.0-44.9, adult ([place])   . Venous insufficiency of lower extremity     Assessment: Patient is a 74yo female admitted for PE. Pharmacy consulted for Heparin dosing.  12/31 @ 1459 HL 0.75 supratherapeutic. Will lower rate to 850 units/hr and plan to recheck 8-hour Anti-Xa @ 2300.  CBC trended slightly, will continue to monitor.  Goal of Therapy:  Heparin level 0.3-0.7 units/ml Monitor platelets by anticoagulation protocol: Yes   Plan:  1/1 @ 1009 HL 1.05 supratherapeutic. Will hold heparin drip x 1 hour and restart at lower rate to 650 units/hr and plan to recheck 6-hour HL (since  level highx2), discussed w/ RN.  Chinita Greenland PharmD Clinical Pharmacist 12/20/2019

## 2019-12-20 NOTE — Progress Notes (Signed)
ANTICOAGULATION CONSULT NOTE - Initial Consult  Pharmacy Consult for Apixaban Indication: pulmonary embolus  Allergies  Allergen Reactions  . Metformin And Related   . Sulfa Antibiotics     Patient Measurements: Height: 5\' 2"  (157.5 cm) Weight: 212 lb 11.9 oz (96.5 kg) IBW/kg (Calculated) : 50.1 Heparin Dosing Weight:    Vital Signs: Temp: 98.5 F (36.9 C) (01/01 1147) Temp Source: Oral (01/01 1147) BP: 122/73 (01/01 1147) Pulse Rate: 74 (01/01 1147)  Labs: Recent Labs    12/18/19 1451 12/18/19 1907 12/19/19 0034 12/19/19 0241 12/19/19 1459 12/19/19 2318 12/20/19 1009  HGB 12.5  --   --  11.2*  --   --   --   HCT 38.5  --   --  35.6*  --   --   --   PLT 174  --   --  158  --   --   --   APTT  --  29  --   --   --   --   --   LABPROT  --  13.1  --   --   --   --   --   INR  --  1.0  --   --   --   --   --   HEPARINUNFRC  --   --   --  1.28* 0.75* 0.88* 1.05*  CREATININE 1.38*  --   --  1.15*  --   --   --   TROPONINIHS 64*  --  418* 418*  --   --   --     Estimated Creatinine Clearance: 46.5 mL/min (A) (by C-G formula based on SCr of 1.15 mg/dL (H)).   Medical History: Past Medical History:  Diagnosis Date  . Adenoma of rectum   . Anemia   . Diabetes mellitus without complication ([place])   . Diverticulosis   . Ganglion cyst    right  . Gout   . Hypertension   . Mixed hyperlipidemia   . Morbid obesity with BMI of 40.0-44.9, adult ([place])   . Venous insufficiency of lower extremity     Medications:  Scheduled:  . allopurinol  100 mg Oral Daily  . apixaban  10 mg Oral BID   Followed by  . [START ON 12/27/2019] apixaban  5 mg Oral BID  . aspirin EC  81 mg Oral Daily  . fenofibrate  160 mg Oral Daily  . insulin aspart  0-15 Units Subcutaneous Q4H  . omega-3 acid ethyl esters  1,000 mg Oral BID  . pantoprazole  40 mg Oral QHS  . pravastatin  20 mg Oral q1800   Infusions:    Assessment: Patient is a 74yo female admitted for PE. Now transitioning from  Heparin drip to apixaban  Goal of Therapy:  Monitor platelets by anticoagulation protocol: Yes   Plan:  Will begin apixaban 10 mg PO BID x 7 days then 5 mg BID for PE treatment. Heparin drip can be discontinued at the time apixaban is given  Travaris Kosh A 12/20/2019,1:16 PM

## 2019-12-20 NOTE — Progress Notes (Signed)
[facility] [place] NAME: Crystal Terrell    MR#:  [id number]  DATE OF BIRTH:  1946-02-08  SUBJECTIVE:   Patient transferred out of ICU. Came in with increasing shortness of breath was found to be her accepted the 70s. Found to have bilateral some massive PE and lower extremity DVT currently on heparin drip. Sats are stable. Patient denies any shortness of breath or chest pain.   REVIEW OF SYSTEMS:   Review of Systems  Constitutional: Negative for chills, fever and weight loss.  HENT: Negative for ear discharge, ear pain and nosebleeds.   Eyes: Negative for blurred vision, pain and discharge.  Respiratory: Negative for sputum production, shortness of breath, wheezing and stridor.   Cardiovascular: Negative for chest pain, palpitations, orthopnea and PND.  Gastrointestinal: Negative for abdominal pain, diarrhea, nausea and vomiting.  Genitourinary: Negative for frequency and urgency.  Musculoskeletal: Negative for back pain and joint pain.  Neurological: Positive for weakness. Negative for sensory change, speech change and focal weakness.  Psychiatric/Behavioral: Negative for depression and hallucinations. The patient is not nervous/anxious.    Tolerating Diet: yes Tolerating PT: pending  DRUG ALLERGIES:   Allergies  Allergen Reactions  . Metformin And Related   . Sulfa Antibiotics     VITALS:  Blood pressure 122/73, pulse 74, temperature 98.5 F (36.9 C), temperature source Oral, resp. rate 16, height 5\' 2"  (1.575 m), weight 96.5 kg, SpO2 100 %.  PHYSICAL EXAMINATION:   Physical Exam  GENERAL:  74 y.o.-year-old patient lying in the bed with no acute distress. obese EYES: Pupils equal, round, reactive to light and accommodation. No scleral icterus. Extraocular muscles intact.  HEENT: Head atraumatic, normocephalic. Oropharynx and nasopharynx clear.  NECK:  Supple, no jugular venous distention. No thyroid enlargement, no  tenderness.  LUNGS: Normal breath sounds bilaterally, no wheezing, rales, rhonchi. No use of accessory muscles of respiration.  CARDIOVASCULAR: S1, S2 normal. No murmurs, rubs, or gallops.  ABDOMEN: Soft, nontender, nondistended. Bowel sounds present. No organomegaly or mass.  EXTREMITIES: No cyanosis, clubbing or edema b/l.    NEUROLOGIC: Cranial nerves II through XII are intact. No focal Motor or sensory deficits b/l.   PSYCHIATRIC:  patient is alert and oriented x 3.  SKIN: No obvious rash, lesion, or ulcer.   LABORATORY PANEL:  CBC Recent Labs  Lab 12/19/19 0241  WBC 7.2  HGB 11.2*  HCT 35.6*  PLT 158    Chemistries  Recent Labs  Lab 12/18/19 1451 12/19/19 0241  NA 141 144  K 3.7 3.8  CL 101 106  CO2 26 27  GLUCOSE 233* 121*  BUN 29* 26*  CREATININE 1.38* 1.15*  CALCIUM 9.9 9.6  AST 28  --   ALT 22  --   ALKPHOS 61  --   BILITOT 0.9  --    Cardiac Enzymes No results for input(s): TROPONINI in the last 168 hours. RADIOLOGY:  CT Angio Chest PE W and/or Wo Contrast  Result Date: 12/18/2019 CLINICAL DATA:  74 year old female with history of hypoxia. Shortness of breath. Weakness. EXAM: CT ANGIOGRAPHY CHEST WITH CONTRAST TECHNIQUE: Multidetector CT imaging of the chest was performed using the standard protocol during bolus administration of intravenous contrast. Multiplanar CT image reconstructions and MIPs were obtained to evaluate the vascular anatomy. CONTRAST:  4mL OMNIPAQUE IOHEXOL 350 MG/ML SOLN COMPARISON:  No priors. FINDINGS: Cardiovascular: Numerous filling defects throughout the pulmonary arterial tree bilaterally involving the distal left main pulmonary artery as  well as lobar, segmental and subsegmental sized branches to the lungs bilaterally, most of which appear nonocclusive. Overall clot burden is very high. Heart size is borderline enlarged. Right ventricle appears dilated compared with the (5.2 cm versus 2.9 cm). There is no significant pericardial fluid,  thickening or pericardial calcification. Aortic atherosclerosis. No definite coronary artery calcifications. Mediastinum/Nodes: No pathologically enlarged mediastinal or hilar lymph nodes. Esophagus is unremarkable in appearance. No axillary lymphadenopathy. Lungs/Pleura: No suspicious appearing pulmonary nodules or masses are noted. No acute consolidative airspace disease. No pleural effusions. Upper Abdomen: Diffuse low attenuation throughout the visualized hepatic parenchyma, indicative of hepatic steatosis. Musculoskeletal: There are no aggressive appearing lytic or blastic lesions noted in the visualized portions of the skeleton. Review of the MIP images confirms the above findings. IMPRESSION: 1. Positive for acute PE with CT evidence of right heart strain (RV/LV Ratio = 1.79) consistent with at least submassive (intermediate risk) PE. The presence of right heart strain has been associated with an increased risk of morbidity and mortality. Please activate Code PE by paging [phone]. 2. Hepatic steatosis. 3. Aortic atherosclerosis. Critical Value/emergent results were called by telephone at the time of interpretation on 12/18/2019 at 7:02 pm to provider [facility], who verbally acknowledged these results. Aortic Atherosclerosis (ICD10-I70.0). Electronically Signed   By: Vinnie Langton M.D.   On: 12/18/2019 19:03   US Venous Img Lower Bilateral (DVT)  Result Date: 12/18/2019 CLINICAL DATA:  PE. EXAM: BILATERAL LOWER EXTREMITY VENOUS DOPPLER ULTRASOUND TECHNIQUE: Gray-scale sonography with graded compression, as well as color Doppler and duplex ultrasound were performed to evaluate the lower extremity deep venous systems from the level of the common femoral vein and including the common femoral, femoral, profunda femoral, popliteal and calf veins including the posterior tibial, peroneal and gastrocnemius veins when visible. The superficial great saphenous vein was also interrogated. Spectral  Doppler was utilized to evaluate flow at rest and with distal augmentation maneuvers in the common femoral, femoral and popliteal veins. COMPARISON:  None. FINDINGS: RIGHT LOWER EXTREMITY Common Femoral Vein: No evidence of thrombus. Normal compressibility, respiratory phasicity and response to augmentation. Saphenofemoral Junction: No evidence of thrombus. Normal compressibility and flow on color Doppler imaging. Profunda Femoral Vein: No evidence of thrombus. Normal compressibility and flow on color Doppler imaging. Femoral Vein: No evidence of thrombus. Normal compressibility, respiratory phasicity and response to augmentation. Popliteal Vein: No evidence of thrombus. Normal compressibility, respiratory phasicity and response to augmentation. Calf Veins: No evidence of thrombus. Normal compressibility and flow on color Doppler imaging. Superficial Great Saphenous Vein: No evidence of thrombus. Normal compressibility. Venous Reflux:  None. Other Findings:  None. LEFT LOWER EXTREMITY Common Femoral Vein: No evidence of thrombus. Normal compressibility, respiratory phasicity and response to augmentation. Saphenofemoral Junction: No evidence of thrombus. Normal compressibility and flow on color Doppler imaging. Profunda Femoral Vein: No evidence of thrombus. Normal compressibility and flow on color Doppler imaging. Femoral Vein: No evidence of thrombus. Normal compressibility, respiratory phasicity and response to augmentation. Popliteal Vein: No evidence of thrombus. Normal compressibility, respiratory phasicity and response to augmentation. Calf Veins: There is occlusive to near occlusive thrombus within the paired left peroneal veins. Superficial Great Saphenous Vein: No evidence of thrombus. Normal compressibility. Venous Reflux:  None. Other Findings:  None. IMPRESSION: Occlusive to near occlusive thrombus within the left peroneal veins. No DVT on the right. Electronically Signed   By: Constance Holster M.D.    On: 12/18/2019 21:43   ECHOCARDIOGRAM COMPLETE  Result Date: 12/19/2019  ECHOCARDIOGRAM REPORT   Patient Name:   Crystal Terrell Date of Exam: 12/19/2019 Medical Rec #:  [id number]        Height:       62.0 in Accession #:    [id number]       Weight:       212.7 lb Date of Birth:  June 08, 1946        BSA:          1.96 m Patient Age:    32 years         BP:           150/78 mmHg Patient Gender: F                HR:           71 bpm. Exam Location:  [facility] Procedure: 2D Echo, Cardiac Doppler and Color Doppler Indications:     Pulmonary embolus 415.19  History:         Patient has no prior history of Echocardiogram examinations.                  Risk Factors:Hypertension and Diabetes.  Sonographer:     Sherrie Sport RDCS (AE) Referring Phys:  [id number] Bradly Bienenstock Diagnosing Phys: Kate Sable MD IMPRESSIONS  1. Left ventricular ejection fraction, by visual estimation, is 60 to 65%. The left ventricle has normal function. There is no left ventricular hypertrophy.  2. Left ventricular diastolic parameters are consistent with Grade I diastolic dysfunction (impaired relaxation).  3. The left ventricle has no regional wall motion abnormalities.  4. Global right ventricle has mildly reduced systolic function.The right ventricular size is mildly enlarged. No increase in right ventricular wall thickness.  5. RV free wall is hypokinetic with normal apical function (McConnell's sign), suggestive of Pulmonary embolus. please correlate with CTA chest.  6. Left atrial size was normal.  7. Right atrial size was normal.  8. The mitral valve is normal in structure. No evidence of mitral valve regurgitation. No evidence of mitral stenosis.  9. The tricuspid valve is normal in structure. 10. The aortic valve is tricuspid. Aortic valve regurgitation is not visualized. No evidence of aortic valve sclerosis or stenosis. 11. The pulmonic valve was not well visualized. Pulmonic valve regurgitation is not visualized. 12. Mildly  elevated pulmonary artery systolic pressure. 13. The inferior vena cava is normal in size with greater than 50% respiratory variability, suggesting right atrial pressure of 3 mmHg. FINDINGS  Left Ventricle: Left ventricular ejection fraction, by visual estimation, is 60 to 65%. The left ventricle has normal function. The left ventricle has no regional wall motion abnormalities. The left ventricular internal cavity size was the left ventricle is normal in size. There is no left ventricular hypertrophy. Left ventricular diastolic parameters are consistent with Grade I diastolic dysfunction (impaired relaxation). Normal left atrial pressure. Right Ventricle: The right ventricular size is mildly enlarged. No increase in right ventricular wall thickness. Global RV systolic function is has mildly reduced systolic function. The tricuspid regurgitant velocity is 3.04 m/s, and with an assumed right atrial pressure of 3 mmHg, the estimated right ventricular systolic pressure is mildly elevated at 40.0 mmHg. RV free wall is hypokinetic with normal apical function (McConnell's sign), suggestive of Pulmonary embolus. please correlate with CTA chest. Left Atrium: Left atrial size was normal in size. Right Atrium: Right atrial size was normal in size Pericardium: There is no evidence of pericardial effusion. Mitral Valve: The mitral valve is normal  in structure. No evidence of mitral valve regurgitation. No evidence of mitral valve stenosis by observation. Tricuspid Valve: The tricuspid valve is normal in structure. Tricuspid valve regurgitation is trivial. Aortic Valve: The aortic valve is tricuspid. Aortic valve regurgitation is not visualized. The aortic valve is structurally normal, with no evidence of sclerosis or stenosis. Aortic valve mean gradient measures 6.0 mmHg. Aortic valve peak gradient measures 11.1 mmHg. Aortic valve area, by VTI measures 2.53 cm. Pulmonic Valve: The pulmonic valve was not well visualized.  Pulmonic valve regurgitation is not visualized. Pulmonic regurgitation is not visualized. Aorta: The aortic root is normal in size and structure. Venous: The inferior vena cava is normal in size with greater than 50% respiratory variability, suggesting right atrial pressure of 3 mmHg. IAS/Shunts: No atrial level shunt detected by color flow Doppler. There is no evidence of a patent foramen ovale. No ventricular septal defect is seen or detected. There is no evidence of an atrial septal defect.  LEFT VENTRICLE PLAX 2D LVIDd:         3.91 cm  Diastology LVIDs:         2.64 cm  LV e' lateral:   6.42 cm/s LV PW:         1.06 cm  LV E/e' lateral: 11.3 LV IVS:        0.87 cm  LV e' medial:    4.68 cm/s LVOT diam:     2.00 cm  LV E/e' medial:  15.6 LV SV:         41 ml LV SV Index:   19.36 LVOT Area:     3.14 cm  RIGHT VENTRICLE RV Basal diam:  4.16 cm RV S prime:     10.20 cm/s TAPSE (M-mode): 3.0 cm LEFT ATRIUM             Index       RIGHT ATRIUM           Index LA diam:        2.40 cm 1.22 cm/m  RA Area:     11.90 cm LA Vol (A2C):   27.4 ml 13.96 ml/m RA Volume:   25.70 ml  13.10 ml/m LA Vol (A4C):   24.6 ml 12.53 ml/m LA Biplane Vol: 27.0 ml 13.76 ml/m  AORTIC VALVE                    PULMONIC VALVE AV Area (Vmax):    2.26 cm     PV Vmax:        0.78 m/s AV Area (Vmean):   2.39 cm     PV Peak grad:   2.4 mmHg AV Area (VTI):     2.53 cm     RVOT Peak grad: 1 mmHg AV Vmax:           166.50 cm/s AV Vmean:          111.500 cm/s AV VTI:            0.288 m AV Peak Grad:      11.1 mmHg AV Mean Grad:      6.0 mmHg LVOT Vmax:         120.00 cm/s LVOT Vmean:        84.700 cm/s LVOT VTI:          0.232 m LVOT/AV VTI ratio: 0.81  AORTA Ao Root diam: 2.70 cm MITRAL VALVE  TRICUSPID VALVE MV Area (PHT): 3.21 cm             TR Peak grad:   37.0 mmHg MV PHT:        68.44 msec           TR Vmax:        304.00 cm/s MV Decel Time: 236 msec MV E velocity: 72.80 cm/s 103 cm/s  SHUNTS MV A velocity: 97.50  cm/s 70.3 cm/s Systemic VTI:  0.23 m MV E/A ratio:  0.75       1.5       Systemic Diam: 2.00 cm  Kate Sable MD Electronically signed by Kate Sable MD Signature Date/Time: 12/19/2019/4:27:28 PM    Final    ASSESSMENT AND PLAN:  Crystal Terrell is a 74 year old female with a past medical history notable for CKD Stage III (baseline Cr. 1.7), diabetes mellitus, hypertension, hyperlipidemia, morbid obesity, chronic venous insufficiency, adenoma of the rectum, anemia, diverticulosis who presents to [facility] ED on 12/18/2019 with complaints of shortness of breath  1. Acute hypoxic respiratory failure secondary to bilateral some massive PE with right heart strain -patient was admitted to ICU now transferred -currently on IV heparin drip hemodynamically stable will change to PO eliquis -risk and benefits are eliquis discussed with patient she is agreeable with plan -echo reviewed -start physical therapy -was seen by vascular surgery did not deemed be candidate for thrombectomy  2. Left peroneal vein DVT likely source of PE -continue eliquis  3. Diabetes mellitus type II -continue home meds and sliding scale insulin  4. Hypertension  resume amlodipine  5. CKD stage III appears at baseline  Anticipate discharge tomorrow if continues to show improvement. Patient appreciates care  Procedures: none Family communication : patient Consults : Discharge Disposition : home CODE STATUS: full DVT Prophylaxis : eliquis  TOTAL TIME TAKING CARE OF THIS PATIENT: **30* minutes.  >50% time spent on counselling and coordination of care  POSSIBLE D/C IN *1 DAYS, DEPENDING ON CLINICAL CONDITION.  Note: This dictation was prepared with Dragon dictation along with smaller phrase technology. Any transcriptional errors that result from this process are unintentional.  Fritzi Mandes M.D on 12/20/2019 at 4:18 PM  Between 7am to 6pm - Pager - [phone]  After 6pm go to [email or web address]  [facility]    CC: Primary care physician; Dion Body, MDPatient ID: Crystal Terrell, female   DOB: 07-Sep-1946, 74 y.o.   MRN: [id number]

## 2019-12-20 NOTE — Care Management Important Message (Signed)
Important Message  Patient Details  Name: Crystal Terrell MRN: [id number] Date of Birth: 10/07/1946   Medicare Important Message Given:  Yes     Juliann Pulse A Anasophia Pecor 12/20/2019, 11:07 AM

## 2019-12-20 NOTE — Progress Notes (Signed)
PT Cancellation Note  Patient Details Name: Crystal Terrell MRN: [id number] DOB: 27-May-1946   Cancelled Treatment:    Reason Eval/Treat Not Completed: Medical issues which prohibited therapy, Patient is still within 48 hour protocol for anticoag x48 hours . Will check back again tomorrow.    Alanson Puls, Jemma DPT 12/20/2019, 1:37 PM

## 2019-12-20 NOTE — Progress Notes (Signed)
[facility] for Heparin Drip Indication: pulmonary embolus  Allergies  Allergen Reactions  . Metformin And Related   . Sulfa Antibiotics     Patient Measurements: Height: 5\' 2"  (157.5 cm) Weight: 212 lb 11.9 oz (96.5 kg) IBW/kg (Calculated) : 50.1 Heparin Dosing Weight: 73.8 kg  Vital Signs: Temp: 99 F (37.2 C) (01/01 0424) BP: 149/70 (01/01 0424) Pulse Rate: 78 (01/01 0424)  Labs: Recent Labs    12/18/19 1451 12/18/19 1907 12/19/19 0034 12/19/19 0241 12/19/19 1459 12/19/19 2318  HGB 12.5  --   --  11.2*  --   --   HCT 38.5  --   --  35.6*  --   --   PLT 174  --   --  158  --   --   APTT  --  29  --   --   --   --   LABPROT  --  13.1  --   --   --   --   INR  --  1.0  --   --   --   --   HEPARINUNFRC  --   --   --  1.28* 0.75* 0.88*  CREATININE 1.38*  --   --  1.15*  --   --   TROPONINIHS 64*  --  418* 418*  --   --     Estimated Creatinine Clearance: 46.5 mL/min (A) (by C-G formula based on SCr of 1.15 mg/dL (H)).   Medical History: Past Medical History:  Diagnosis Date  . Adenoma of rectum   . Anemia   . Diabetes mellitus without complication ([place])   . Diverticulosis   . Ganglion cyst    right  . Gout   . Hypertension   . Mixed hyperlipidemia   . Morbid obesity with BMI of 40.0-44.9, adult ([place])   . Venous insufficiency of lower extremity     Assessment: Patient is a 74yo female admitted for PE. Pharmacy consulted for Heparin dosing.  Goal of Therapy:  Heparin level 0.3-0.7 units/ml Monitor platelets by anticoagulation protocol: Yes   Plan:  12/31 @ 1459 HL 0.75 supratherapeutic. Will lower rate to 850 units/hr and plan to recheck 8-hour Anti-Xa @ 2300.  CBC trended slightly, will continue to monitor. 12/31 @ 2318 HL 0.88.  1/1  Rechecking HL now  Chinita Greenland PharmD Clinical Pharmacist 12/20/2019

## 2019-12-21 DIAGNOSIS — I10 Essential (primary) hypertension: Secondary | ICD-10-CM

## 2019-12-21 LAB — GLUCOSE, CAPILLARY
Glucose-Capillary: 109 mg/dL — ABNORMAL HIGH (ref 70–99)
Glucose-Capillary: 131 mg/dL — ABNORMAL HIGH (ref 70–99)
Glucose-Capillary: 177 mg/dL — ABNORMAL HIGH (ref 70–99)

## 2019-12-21 MED ORDER — GLIMEPIRIDE 4 MG PO TABS: 4.0000 mg | ORAL_TABLET | Freq: Every day | ORAL | 0 refills | Status: AC

## 2019-12-21 MED ORDER — APIXABAN 5 MG PO TABS: 10.0000 mg | ORAL_TABLET | Freq: Two times a day (BID) | ORAL | 2 refills | Status: AC

## 2019-12-21 NOTE — Discharge Instructions (Addendum)
Pulmonary Embolism  A pulmonary embolism (PE) is a sudden blockage or decrease of blood flow in one or both lungs. Most blockages come from a blood clot that forms in the vein of a lower leg, thigh, or arm (deep vein thrombosis, DVT) and travels to the lungs. A clot is blood that has thickened into a gel or solid. PE is a dangerous and life-threatening condition that needs to be treated right away. What are the causes? This condition is usually caused by a blood clot that forms in a vein and moves to the lungs. In rare cases, it may be caused by air, fat, part of a tumor, or other tissue that moves through the veins and into the lungs. What increases the risk? The following factors may make you more likely to develop this condition:  Experiencing a traumatic injury, such as breaking a hip or leg.  Having: ? A spinal cord injury. ? Orthopedic surgery, especially hip or knee replacement. ? Any major surgery. ? A stroke. ? DVT. ? Blood clots or blood clotting disease. ? Long-term (chronic) lung or heart disease. ? Cancer treated with chemotherapy. ? A central venous catheter.  Taking medicines that contain estrogen. These include birth control pills and hormone replacement therapy.  Being: ? Pregnant. ? In the period of time after your baby is delivered (postpartum). ? Older than age 35. ? Overweight. ? A smoker, especially if you have other risks. What are the signs or symptoms? Symptoms of this condition usually start suddenly and include:  Shortness of breath during activity or at rest.  Coughing, coughing up blood, or coughing up blood-tinged mucus.  Chest pain that is often worse with deep breaths.  Rapid or irregular heartbeat.  Feeling light-headed or dizzy.  Fainting.  Feeling anxious.  Fever.  Sweating.  Pain and swelling in a leg. This is a symptom of DVT, which can lead to PE. How is this diagnosed? This condition may be diagnosed based on:  Your  medical history.  A physical exam.  Blood tests.  CT pulmonary angiogram. This test checks blood flow in and around your lungs.  Ventilation-perfusion scan, also called a lung VQ scan. This test measures air flow and blood flow to the lungs.  An ultrasound of the legs. How is this treated? Treatment for this condition depends on many factors, such as the cause of your PE, your risk for bleeding or developing more clots, and other medical conditions you have. Treatment aims to remove, dissolve, or stop blood clots from forming or growing larger. Treatment may include:  Medicines, such as: ? Blood thinning medicines (anticoagulants) to stop clots from forming. ? Medicines that dissolve clots (thrombolytics).  Procedures, such as: ? Using a flexible tube to remove a blood clot (embolectomy) or to deliver medicine to destroy it (catheter-directed thrombolysis). ? Inserting a filter into a large vein that carries blood to the heart (inferior vena cava). This filter (vena cava filter) catches blood clots before they reach the lungs. ? Surgery to remove the clot (surgical embolectomy). This is rare. You may need a combination of immediate, long-term (up to 3 months after diagnosis), and extended (more than 3 months after diagnosis) treatments. Your treatment may continue for several months (maintenance therapy). You and your health care provider will work together to choose the treatment program that is best for you. Follow these instructions at home: Medicines  Take over-the-counter and prescription medicines only as told by your health care provider.  If  you are taking an anticoagulant medicine: ? Take the medicine every day at the same time each day. ? Understand what foods and drugs interact with your medicine. ? Understand the side effects of this medicine, including excessive bruising or bleeding. Ask your health care provider or pharmacist about other side effects. General  instructions  Wear a medical alert bracelet or carry a medical alert card that says you have had a PE and lists what medicines you take.  Ask your health care provider when you may return to your normal activities. Avoid sitting or lying for a long time without moving.  Maintain a healthy weight. Ask your health care provider what weight is healthy for you.  Do not use any products that contain nicotine or tobacco, such as cigarettes, e-cigarettes, and chewing tobacco. If you need help quitting, ask your health care provider.  Talk with your health care provider about any travel plans. It is important to make sure that you are still able to take your medicine while on trips.  Keep all follow-up visits as told by your health care provider. This is important. Contact a health care provider if:  You missed a dose of your blood thinner medicine. Get help right away if:  You have: ? New or increased pain, swelling, warmth, or redness in an arm or leg. ? Numbness or tingling in an arm or leg. ? Shortness of breath during activity or at rest. ? A fever. ? Chest pain. ? A rapid or irregular heartbeat. ? A severe headache. ? Vision changes. ? A serious fall or accident, or you hit your head. ? Stomach (abdominal) pain. ? Blood in your vomit, stool, or urine. ? A cut that will not stop bleeding.  You cough up blood.  You feel light-headed or dizzy.  You cannot move your arms or legs.  You are confused or have memory loss. These symptoms may represent a serious problem that is an emergency. Do not wait to see if the symptoms will go away. Get medical help right away. Call your local emergency services (911 in the [place] Do not drive yourself to the hospital. Summary  A pulmonary embolism (PE) is a sudden blockage or decrease of blood flow in one or both lungs. PE is a dangerous and life-threatening condition that needs to be treated right away.  Treatments for this condition usually  include medicines to thin your blood (anticoagulants) or medicines to break apart blood clots (thrombolytics).  If you are given blood thinners, it is important to take the medicine every day at the same time each day.  Understand what foods and drugs interact with any medicines that you are taking.  If you have signs of PE or DVT, call your local emergency services (911 in the [place] This information is not intended to replace advice given to you by your health care provider. Make sure you discuss any questions you have with your health care provider. Document Revised: 09/12/2018 Document Reviewed: 09/12/2018 Elsevier Patient Education  [place] log of blood sugars at home

## 2019-12-21 NOTE — Progress Notes (Signed)
MD ordered patient to be discharged home.  Discharge instructions were reviewed with the patient and she voiced understanding.  Patient instructed on making follow-up appointment.  Prescriptions sent to the patients pharmacy.  IV was removed with catheter intact.  All patients questions were answered.  Patient left via wheelchair escorted by NT.

## 2019-12-21 NOTE — Discharge Summary (Signed)
[place] [place] NAME: Crystal Terrell    MR#:  [id number]  DATE OF BIRTH:  10-11-1946  DATE OF ADMISSION:  12/18/2019 ADMITTING PHYSICIAN: Ottie Glazier, MD  DATE OF DISCHARGE: 12/21/2019  PRIMARY CARE PHYSICIAN: Dion Body, MD    ADMISSION DIAGNOSIS:  Shortness of breath [R06.02] Pulmonary embolism ([place]) [I26.99] Hypoxia [R09.02] Pulmonary embolism, other, unspecified chronicity, unspecified whether acute cor pulmonale present ([place]) [I26.99]  DISCHARGE DIAGNOSIS:  Acute Submassive Bilateral PE Left Peroneal vein DVT  SECONDARY DIAGNOSIS:   Past Medical History:  Diagnosis Date  . Adenoma of rectum   . Anemia   . Diabetes mellitus without complication ([place])   . Diverticulosis   . Ganglion cyst    right  . Gout   . Hypertension   . Mixed hyperlipidemia   . Morbid obesity with BMI of 40.0-44.9, adult ([place])   . Venous insufficiency of lower extremity     HOSPITAL COURSE:  [name] is a 74 year old female with a past medical history notable forCKD Stage III (baseline Cr. 1.7),diabetes mellitus, hypertension, hyperlipidemia, morbid obesity, chronic venous insufficiency, adenoma of the rectum, anemia, diverticulosis who presents to [facility] ED on 12/18/2019 with complaints of shortness of breath  1. Acute hypoxic respiratory failure secondary to bilateral submassive PE with right heart strain -patient was admitted to ICU now transferred -wason IV heparin drip hemodynamically stable will change to PO eliquis -risk and benefits are eliquis discussed with patient she is agreeable with plan -echo reviewed - physical therapy evaluation completed. Did well pt declines PT for home -was seen by vascular surgery did not deemed be candidate for thrombectomy  2. Left peroneal vein DVT likely source of PE -continue eliquis  3. Diabetes mellitus type II -continue home meds and sliding scale insulin  4. Hypertension   resume amlodipine  5. CKD stage III appears at baseline  Overall hemodynamically stable D/c home  CONSULTS OBTAINED:  Treatment Team:  Algernon Huxley, MD  DRUG ALLERGIES:   Allergies  Allergen Reactions  . Metformin And Related   . Sulfa Antibiotics     DISCHARGE MEDICATIONS:   Allergies as of 12/21/2019      Reactions   Metformin And Related    Sulfa Antibiotics       Medication List    STOP taking these medications   aspirin EC 81 MG tablet     TAKE these medications   allopurinol 100 MG tablet Commonly known as: ZYLOPRIM Take 200 mg by mouth daily.   amLODipine 10 MG tablet Commonly known as: NORVASC Take 10 mg by mouth daily.   apixaban 5 MG Tabs tablet Commonly known as: ELIQUIS Take 2 tablets (10 mg total) by mouth 2 (two) times daily. And from 12/27/19 take 5 mg (1 tab) two times a day   Dulaglutide 1.5 MG/0.5ML Sopn Inject 1.5 mg into the skin once a week.   fenofibrate 160 MG tablet Take 160 mg by mouth daily.   furosemide 40 MG tablet Commonly known as: LASIX Take 40 mg by mouth.   glimepiride 4 MG tablet Commonly known as: AMARYL Take 1 tablet (4 mg total) by mouth daily with breakfast. What changed: when to take this   losartan 100 MG tablet Commonly known as: COZAAR Take 100 mg by mouth daily.   lovastatin 20 MG tablet Commonly known as: MEVACOR Take 20 mg by mouth daily.   metoprolol succinate 100 MG 24 hr tablet Commonly known as: TOPROL-XL  Take 100 mg by mouth daily. Take with or immediately following a meal.   naproxen sodium 220 MG tablet Commonly known as: ALEVE Take 220 mg by mouth daily as needed (pain).   Omega-3 1000 MG Caps Take 1,000 mg by mouth 2 (two) times daily.       If you experience worsening of your admission symptoms, develop shortness of breath, life threatening emergency, suicidal or homicidal thoughts you must seek medical attention immediately by calling 911 or calling your MD immediately  if symptoms  less severe.  You Must read complete instructions/literature along with all the possible adverse reactions/side effects for all the Medicines you take and that have been prescribed to you. Take any new Medicines after you have completely understood and accept all the possible adverse reactions/side effects.   Please note  You were cared for by a hospitalist during your hospital stay. If you have any questions about your discharge medications or the care you received while you were in the hospital after you are discharged, you can call the unit and asked to speak with the hospitalist on call if the hospitalist that took care of you is not available. Once you are discharged, your primary care physician will handle any further medical issues. Please note that NO REFILLS for any discharge medications will be authorized once you are discharged, as it is imperative that you return to your primary care physician (or establish a relationship with a primary care physician if you do not have one) for your aftercare needs so that they can reassess your need for medications and monitor your lab values. Today   SUBJECTIVE  Doing well   VITAL SIGNS:  Blood pressure (!) 148/87, pulse 95, temperature 98.7 F (37.1 C), temperature source Oral, resp. rate 20, height 5\' 2"  (1.575 m), weight 97 kg, SpO2 95 %.  I/O:    Intake/Output Summary (Last 24 hours) at 12/21/2019 1003 Last data filed at 12/21/2019 0900 Gross per 24 hour  Intake 240 ml  Output --  Net 240 ml    PHYSICAL EXAMINATION:  GENERAL:  74 y.o.-year-old patient lying in the bed with no acute distress.  EYES: Pupils equal, round, reactive to light and accommodation. No scleral icterus. Extraocular muscles intact.  HEENT: Head atraumatic, normocephalic. Oropharynx and nasopharynx clear.  NECK:  Supple, no jugular venous distention. No thyroid enlargement, no tenderness.  LUNGS: Normal breath sounds bilaterally, no wheezing, rales,rhonchi or  crepitation. No use of accessory muscles of respiration.  CARDIOVASCULAR: S1, S2 normal. No murmurs, rubs, or gallops.  ABDOMEN: Soft, non-tender, non-distended. Bowel sounds present. No organomegaly or mass.  EXTREMITIES: No pedal edema, cyanosis, or clubbing.  NEUROLOGIC: Cranial nerves II through XII are intact. Muscle strength 5/5 in all extremities. Sensation intact. Gait not checked.  PSYCHIATRIC: The patient is alert and oriented x 3.  SKIN: No obvious rash, lesion, or ulcer.   DATA REVIEW:   CBC  Recent Labs  Lab 12/19/19 0241  WBC 7.2  HGB 11.2*  HCT 35.6*  PLT 158    Chemistries  Recent Labs  Lab 12/18/19 1451 12/19/19 0241  NA 141 144  K 3.7 3.8  CL 101 106  CO2 26 27  GLUCOSE 233* 121*  BUN 29* 26*  CREATININE 1.38* 1.15*  CALCIUM 9.9 9.6  AST 28  --   ALT 22  --   ALKPHOS 61  --   BILITOT 0.9  --     Microbiology Results   Recent Results (from  the past 240 hour(s))  Respiratory Panel by RT PCR (Flu A&B, Covid) - Nasopharyngeal Swab     Status: None   Collection Time: 12/18/19  3:58 PM   Specimen: Nasopharyngeal Swab  Result Value Ref Range Status   SARS Coronavirus 2 by RT PCR NEGATIVE NEGATIVE Final    Comment: (NOTE) SARS-CoV-2 target nucleic acids are NOT DETECTED. The SARS-CoV-2 RNA is generally detectable in upper respiratoy specimens during the acute phase of infection. The lowest concentration of SARS-CoV-2 viral copies this assay can detect is 131 copies/mL. A negative result does not preclude SARS-Cov-2 infection and should not be used as the sole basis for treatment or other patient management decisions. A negative result may occur with  improper specimen collection/handling, submission of specimen other than nasopharyngeal swab, presence of viral mutation(s) within the areas targeted by this assay, and inadequate number of viral copies (<131 copies/mL). A negative result must be combined with clinical observations, patient history,  and epidemiological information. The expected result is Negative. Fact Sheet for Patients:  PinkCheek.be Fact Sheet for Healthcare Providers:  GravelBags.it This test is not yet ap proved or cleared by the [place] FDA and  has been authorized for detection and/or diagnosis of SARS-CoV-2 by FDA under an Emergency Use Authorization (EUA). This EUA will remain  in effect (meaning this test can be used) for the duration of the COVID-19 declaration under Section 564(b)(1) of the Act, 21 U.S.C. section 360bbb-3(b)(1), unless the authorization is terminated or revoked sooner.    Influenza A by PCR NEGATIVE NEGATIVE Final   Influenza B by PCR NEGATIVE NEGATIVE Final    Comment: (NOTE) The Xpert Xpress SARS-CoV-2/FLU/RSV assay is intended as an aid in  the diagnosis of influenza from Nasopharyngeal swab specimens and  should not be used as a sole basis for treatment. Nasal washings and  aspirates are unacceptable for Xpert Xpress SARS-CoV-2/FLU/RSV  testing. Fact Sheet for Patients: PinkCheek.be Fact Sheet for Healthcare Providers: GravelBags.it This test is not yet approved or cleared by the [place] FDA and  has been authorized for detection and/or diagnosis of SARS-CoV-2 by  FDA under an Emergency Use Authorization (EUA). This EUA will remain  in effect (meaning this test can be used) for the duration of the  Covid-19 declaration under Section 564(b)(1) of the Act, 21  U.S.C. section 360bbb-3(b)(1), unless the authorization is  terminated or revoked. Performed at [facility], [address] [place], [place] [address]   MRSA PCR Screening     Status: None   Collection Time: 12/18/19  9:40 PM   Specimen: Nasopharyngeal  Result Value Ref Range Status   MRSA by PCR NEGATIVE NEGATIVE Final    Comment:        The GeneXpert MRSA Assay (FDA approved for  NASAL specimens only), is one component of a comprehensive MRSA colonization surveillance program. It is not intended to diagnose MRSA infection nor to guide or monitor treatment for MRSA infections. Performed at [facility], [address] [place], [place] [address]     RADIOLOGY:  ECHOCARDIOGRAM COMPLETE  Result Date: 12/19/2019   ECHOCARDIOGRAM REPORT   Patient Name:   RONIA FERRY Date of Exam: 12/19/2019 Medical Rec #:  [id number]        Height:       62.0 in Accession #:    [id number]       Weight:       212.7 lb Date of Birth:  1946-12-14  BSA:          1.96 m Patient Age:    54 years         BP:           150/78 mmHg Patient Gender: F                HR:           71 bpm. Exam Location:  [facility] Procedure: 2D Echo, Cardiac Doppler and Color Doppler Indications:     Pulmonary embolus 415.19  History:         Patient has no prior history of Echocardiogram examinations.                  Risk Factors:Hypertension and Diabetes.  Sonographer:     Sherrie Sport RDCS (AE) Referring Phys:  [id number] Bradly Bienenstock Diagnosing Phys: Kate Sable MD IMPRESSIONS  1. Left ventricular ejection fraction, by visual estimation, is 60 to 65%. The left ventricle has normal function. There is no left ventricular hypertrophy.  2. Left ventricular diastolic parameters are consistent with Grade I diastolic dysfunction (impaired relaxation).  3. The left ventricle has no regional wall motion abnormalities.  4. Global right ventricle has mildly reduced systolic function.The right ventricular size is mildly enlarged. No increase in right ventricular wall thickness.  5. RV free wall is hypokinetic with normal apical function (McConnell's sign), suggestive of Pulmonary embolus. please correlate with CTA chest.  6. Left atrial size was normal.  7. Right atrial size was normal.  8. The mitral valve is normal in structure. No evidence of mitral valve regurgitation. No evidence of mitral stenosis.  9. The  tricuspid valve is normal in structure. 10. The aortic valve is tricuspid. Aortic valve regurgitation is not visualized. No evidence of aortic valve sclerosis or stenosis. 11. The pulmonic valve was not well visualized. Pulmonic valve regurgitation is not visualized. 12. Mildly elevated pulmonary artery systolic pressure. 13. The inferior vena cava is normal in size with greater than 50% respiratory variability, suggesting right atrial pressure of 3 mmHg. FINDINGS  Left Ventricle: Left ventricular ejection fraction, by visual estimation, is 60 to 65%. The left ventricle has normal function. The left ventricle has no regional wall motion abnormalities. The left ventricular internal cavity size was the left ventricle is normal in size. There is no left ventricular hypertrophy. Left ventricular diastolic parameters are consistent with Grade I diastolic dysfunction (impaired relaxation). Normal left atrial pressure. Right Ventricle: The right ventricular size is mildly enlarged. No increase in right ventricular wall thickness. Global RV systolic function is has mildly reduced systolic function. The tricuspid regurgitant velocity is 3.04 m/s, and with an assumed right atrial pressure of 3 mmHg, the estimated right ventricular systolic pressure is mildly elevated at 40.0 mmHg. RV free wall is hypokinetic with normal apical function (McConnell's sign), suggestive of Pulmonary embolus. please correlate with CTA chest. Left Atrium: Left atrial size was normal in size. Right Atrium: Right atrial size was normal in size Pericardium: There is no evidence of pericardial effusion. Mitral Valve: The mitral valve is normal in structure. No evidence of mitral valve regurgitation. No evidence of mitral valve stenosis by observation. Tricuspid Valve: The tricuspid valve is normal in structure. Tricuspid valve regurgitation is trivial. Aortic Valve: The aortic valve is tricuspid. Aortic valve regurgitation is not visualized. The aortic  valve is structurally normal, with no evidence of sclerosis or stenosis. Aortic valve mean gradient measures 6.0 mmHg. Aortic valve peak gradient measures  11.1 mmHg. Aortic valve area, by VTI measures 2.53 cm. Pulmonic Valve: The pulmonic valve was not well visualized. Pulmonic valve regurgitation is not visualized. Pulmonic regurgitation is not visualized. Aorta: The aortic root is normal in size and structure. Venous: The inferior vena cava is normal in size with greater than 50% respiratory variability, suggesting right atrial pressure of 3 mmHg. IAS/Shunts: No atrial level shunt detected by color flow Doppler. There is no evidence of a patent foramen ovale. No ventricular septal defect is seen or detected. There is no evidence of an atrial septal defect.  LEFT VENTRICLE PLAX 2D LVIDd:         3.91 cm  Diastology LVIDs:         2.64 cm  LV e' lateral:   6.42 cm/s LV PW:         1.06 cm  LV E/e' lateral: 11.3 LV IVS:        0.87 cm  LV e' medial:    4.68 cm/s LVOT diam:     2.00 cm  LV E/e' medial:  15.6 LV SV:         41 ml LV SV Index:   19.36 LVOT Area:     3.14 cm  RIGHT VENTRICLE RV Basal diam:  4.16 cm RV S prime:     10.20 cm/s TAPSE (M-mode): 3.0 cm LEFT ATRIUM             Index       RIGHT ATRIUM           Index LA diam:        2.40 cm 1.22 cm/m  RA Area:     11.90 cm LA Vol (A2C):   27.4 ml 13.96 ml/m RA Volume:   25.70 ml  13.10 ml/m LA Vol (A4C):   24.6 ml 12.53 ml/m LA Biplane Vol: 27.0 ml 13.76 ml/m  AORTIC VALVE                    PULMONIC VALVE AV Area (Vmax):    2.26 cm     PV Vmax:        0.78 m/s AV Area (Vmean):   2.39 cm     PV Peak grad:   2.4 mmHg AV Area (VTI):     2.53 cm     RVOT Peak grad: 1 mmHg AV Vmax:           166.50 cm/s AV Vmean:          111.500 cm/s AV VTI:            0.288 m AV Peak Grad:      11.1 mmHg AV Mean Grad:      6.0 mmHg LVOT Vmax:         120.00 cm/s LVOT Vmean:        84.700 cm/s LVOT VTI:          0.232 m LVOT/AV VTI ratio: 0.81  AORTA Ao Root diam: 2.70  cm MITRAL VALVE                        TRICUSPID VALVE MV Area (PHT): 3.21 cm             TR Peak grad:   37.0 mmHg MV PHT:        68.44 msec           TR Vmax:        304.00 cm/s  MV Decel Time: 236 msec MV E velocity: 72.80 cm/s 103 cm/s  SHUNTS MV A velocity: 97.50 cm/s 70.3 cm/s Systemic VTI:  0.23 m MV E/A ratio:  0.75       1.5       Systemic Diam: 2.00 cm  Kate Sable MD Electronically signed by Kate Sable MD Signature Date/Time: 12/19/2019/4:27:28 PM    Final      CODE STATUS:     Code Status Orders  (From admission, onward)         Start     Ordered   12/18/19 2009  Full code  Continuous     12/18/19 2010        Code Status History    This patient has a current code status but no historical code status.   Advance Care Planning Activity       TOTAL TIME TAKING CARE OF THIS PATIENT: *40* minutes.    Fritzi Mandes M.D on 12/21/2019 at 10:03 AM  Between 7am to 6pm - Pager - [phone] After 6pm go to [email or web address] - password [email or web address]  [facility]    CC: Primary care physician; Dion Body, MD

## 2019-12-21 NOTE — Evaluation (Signed)
Physical Therapy Evaluation Patient Details Name: Crystal Terrell MRN: [id number] DOB: 16-Jul-1946 Today's Date: 12/21/2019   History of Present Illness  Crystal Terrell is a 74 y.o. female who was admitted to the hospital through emergency department where she presented from through EMS due to shortness of breath. She was admitted to the ICU due to submassive PE with evidence of R heart strain and DVT in L peroneal vein. Not deemed a candidate for thormbectomy. The patient was placed on heparin drip over 48 hours and has transitioned out of ICU. Relevant PMH include morbid obesity, venous insufficiencey of lower extremity, adenoma of rectum, DM, diverticulitis, gout, HTN, hyperlipidemia, CKD stage III. Imaging consistent with above diagnosis.    Clinical Impression  Patient alert and oriented x 4 and able to provide a detailed history. Lives in a single story home with her husband with 2 steps to enter and was I in all aspects of care and mobility prior to hospitalization. Reports baseline fatigue with ambulation. Upon PT evaluation, patient appears functionally independent and is not in need of further physical therapy services. She was limited by shortness of breath and her SpO2 dropped to 88% with community distance ambulation + stair navigation and returned to 95% with rest. Educated her on breathing techniques, energy conservation, and pacing which she verbalized and demonstrated understanding of. Patient will be discharged from physical therapy caseload at this time and recommended to ambulate 3x a day to prevent functional decline over the course of her hospital stay.     Follow Up Recommendations No PT follow up    Equipment Recommendations  None recommended by PT    Recommendations for Other Services       Precautions / Restrictions Restrictions Weight Bearing Restrictions: No      Mobility  Bed Mobility Overal bed mobility: Independent             General bed mobility  comments: supine to sit I  Transfers Overall transfer level: Independent               General transfer comment: sit <> stand I  Ambulation/Gait Ambulation/Gait assistance: Modified independent (Device/Increase time) Gait Distance (Feet): 240 Feet Assistive device: None   Gait velocity: 1.2 ft/second Gait velocity interpretation: <1.31 ft/sec, indicative of household ambulator General Gait Details: Patient ambulates at slow pace without any evidence of loss of balance. Fatigues quickly and SpO2 dropped slightly and HR increased with effort.  Stairs Stairs: Yes Stairs assistance: Min guard Stair Management: No rails;Step to pattern Number of Stairs: 8 General stair comments: Patient attempted stairs directly after ambulatin 200 feet and required rest for 1-2 min at base of steps before ascending. SpO2 had dropped to 88% but recovered as expected with rest to 93% before continuing. HR climbed to 123 bpm and also trended towards recovery with rest.  Wheelchair Mobility    Modified Rankin (Stroke Patients Only)       Balance Overall balance assessment: Needs assistance   Sitting balance-Leahy Scale: Normal       Standing balance-Leahy Scale: Good Standing balance comment: no obvious unsteadiness on feet. Patient moves slowly and carefully. Reaches towards wall when very close but can easily stop with cuing.  Reports no falls in last 6 months.                             Pertinent Vitals/Pain Pain Assessment: 0-10 Pain Score: 1  Pain Location: R  shoudler and hip Pain Intervention(s): Monitored during session;Limited activity within patient's tolerance    Home Living Family/patient expects to be discharged to:: Private residence Living Arrangements: Spouse/significant other Available Help at Discharge: Family Type of Home: House Home Access: Stairs to enter Entrance Stairs-Rails: None Technical brewer of Steps: 2 Home Layout: One level Home  Equipment: Shower seat - built in Additional Comments: pts husband does not require her care and stays home with her    Prior Function Level of Independence: Independent         Comments: independent with all ambulation and all aspects of care prior to hospitalization.     Hand Dominance   Dominant Hand: Right    Extremity/Trunk Assessment   Upper Extremity Assessment Upper Extremity Assessment: Overall WFL for tasks assessed    Lower Extremity Assessment Lower Extremity Assessment: Overall WFL for tasks assessed    Cervical / Trunk Assessment Cervical / Trunk Assessment: Normal  Communication   Communication: No difficulties  Cognition Arousal/Alertness: Awake/alert Behavior During Therapy: WFL for tasks assessed/performed Overall Cognitive Status: Within Functional Limits for tasks assessed                                        General Comments      Exercises Other Exercises Other Exercises: educated on pacing and energy conservation   Assessment/Plan    PT Assessment All further PT needs can be met in the next venue of care;Patent does not need any further PT services  PT Problem List Decreased activity tolerance;Cardiopulmonary status limiting activity       PT Treatment Interventions      PT Goals (Current goals can be found in the Care Plan section)  Acute Rehab PT Goals Patient Stated Goal: return home PT Goal Formulation: With patient Time For Goal Achievement: 01/04/20 Potential to Achieve Goals: Good    Frequency     Barriers to discharge        Co-evaluation               AM-PAC PT "6 Clicks" Mobility  Outcome Measure Help needed turning from your back to your side while in a flat bed without using bedrails?: None Help needed moving from lying on your back to sitting on the side of a flat bed without using bedrails?: None Help needed moving to and from a bed to a chair (including a wheelchair)?: None Help needed  standing up from a chair using your arms (e.g., wheelchair or bedside chair)?: None Help needed to walk in hospital room?: None Help needed climbing 3-5 steps with a railing? : None 6 Click Score: 24    End of Session Equipment Utilized During Treatment: Gait belt Activity Tolerance: Patient tolerated treatment well Patient left: in chair;with call bell/phone within reach Nurse Communication: Mobility status PT Visit Diagnosis: Difficulty in walking, not elsewhere classified (R26.2)    Time: 6759-1638 PT Time Calculation (min) (ACUTE ONLY): 23 min   Charges:   PT Evaluation $PT Eval Low Complexity: 1 Low PT Treatments $Therapeutic Activity: 8-22 mins        Everlean Alstrom. Graylon Good, PT, DPT 12/21/19, 10:06 AM

## 2019-12-25 DIAGNOSIS — Z86718 Personal history of other venous thrombosis and embolism: Secondary | ICD-10-CM

## 2020-02-10 ENCOUNTER — Ambulatory Visit: Payer: Medicare Other | Attending: Internal Medicine

## 2020-02-10 DIAGNOSIS — Z23 Encounter for immunization: Secondary | ICD-10-CM | POA: Insufficient documentation

## 2020-02-10 NOTE — Progress Notes (Signed)
   Covid-19 Vaccination Clinic  Name:  NOELANI LEFORT    MRN: [id number] DOB: 1946-12-18  02/10/2020  Ms. Golis was observed post Covid-19 immunization for 15 minutes without incidence. She was provided with Vaccine Information Sheet and instruction to access the V-Safe system.   Ms. Garry was instructed to call 911 with any severe reactions post vaccine: Marland Kitchen Difficulty breathing  . Swelling of your face and throat  . A fast heartbeat  . A bad rash all over your body  . Dizziness and weakness    Immunizations Administered    Name Date Dose VIS Date Route   Moderna COVID-19 Vaccine 02/10/2020  3:16 PM 0.5 mL 11/19/2019 Intramuscular   Manufacturer: Moderna   Lot: [id number]   [id number]

## 2020-03-10 ENCOUNTER — Ambulatory Visit: Payer: Medicare Other | Attending: Internal Medicine

## 2020-03-10 DIAGNOSIS — Z23 Encounter for immunization: Secondary | ICD-10-CM

## 2020-03-10 NOTE — Progress Notes (Signed)
   Covid-19 Vaccination Clinic  Name:  Crystal Terrell    MRN: [id number] DOB: Apr 03, 1946  03/10/2020  Crystal Terrell was observed post Covid-19 immunization for 15 minutes without incident. She was provided with Vaccine Information Sheet and instruction to access the V-Safe system.   Crystal Terrell was instructed to call 911 with any severe reactions post vaccine: Marland Kitchen Difficulty breathing  . Swelling of face and throat  . A fast heartbeat  . A bad rash all over body  . Dizziness and weakness   Immunizations Administered    Name Date Dose VIS Date Route   Moderna COVID-19 Vaccine 03/10/2020 10:58 AM 0.5 mL 11/19/2019 Intramuscular   Manufacturer: Levan Hurst   LotKK:[id number]   ReynoldsVO:[id number]

## 2020-05-11 DIAGNOSIS — D638 Anemia in other chronic diseases classified elsewhere: Secondary | ICD-10-CM | POA: Insufficient documentation

## 2020-06-24 DIAGNOSIS — M25551 Pain in right hip: Secondary | ICD-10-CM | POA: Insufficient documentation

## 2020-06-24 DIAGNOSIS — G8929 Other chronic pain: Secondary | ICD-10-CM | POA: Insufficient documentation

## 2020-06-30 DIAGNOSIS — R0902 Hypoxemia: Secondary | ICD-10-CM

## 2020-07-20 ENCOUNTER — Other Ambulatory Visit: Payer: Self-pay | Admitting: Orthopedic Surgery

## 2020-07-20 ENCOUNTER — Ambulatory Visit (INDEPENDENT_AMBULATORY_CARE_PROVIDER_SITE_OTHER): Payer: Medicare Other | Admitting: Vascular Surgery

## 2020-07-20 ENCOUNTER — Telehealth (INDEPENDENT_AMBULATORY_CARE_PROVIDER_SITE_OTHER): Payer: Self-pay

## 2020-07-20 ENCOUNTER — Other Ambulatory Visit (INDEPENDENT_AMBULATORY_CARE_PROVIDER_SITE_OTHER): Payer: Self-pay | Admitting: Nurse Practitioner

## 2020-07-20 ENCOUNTER — Other Ambulatory Visit
Admission: RE | Admit: 2020-07-20 | Discharge: 2020-07-20 | Disposition: A | Discharge status: Home / Self Care | Payer: Medicare Other | Source: Ambulatory Visit | Attending: Vascular Surgery | Admitting: Vascular Surgery

## 2020-07-20 ENCOUNTER — Encounter (INDEPENDENT_AMBULATORY_CARE_PROVIDER_SITE_OTHER): Payer: Self-pay | Admitting: Vascular Surgery

## 2020-07-20 ENCOUNTER — Other Ambulatory Visit: Payer: Self-pay

## 2020-07-20 VITALS — BP 114/67 | HR 58 | Resp 16 | Ht 62.0 in | Wt 215.0 lb

## 2020-07-20 DIAGNOSIS — M25551 Pain in right hip: Secondary | ICD-10-CM

## 2020-07-20 DIAGNOSIS — I2782 Chronic pulmonary embolism: Secondary | ICD-10-CM | POA: Diagnosis not present

## 2020-07-20 DIAGNOSIS — E119 Type 2 diabetes mellitus without complications: Secondary | ICD-10-CM | POA: Diagnosis not present

## 2020-07-20 DIAGNOSIS — Z20822 Contact with and (suspected) exposure to covid-19: Secondary | ICD-10-CM | POA: Insufficient documentation

## 2020-07-20 DIAGNOSIS — Z01812 Encounter for preprocedural laboratory examination: Secondary | ICD-10-CM | POA: Insufficient documentation

## 2020-07-20 DIAGNOSIS — I10 Essential (primary) hypertension: Secondary | ICD-10-CM | POA: Diagnosis not present

## 2020-07-20 DIAGNOSIS — E782 Mixed hyperlipidemia: Secondary | ICD-10-CM | POA: Insufficient documentation

## 2020-07-20 DIAGNOSIS — G8929 Other chronic pain: Secondary | ICD-10-CM

## 2020-07-20 NOTE — Progress Notes (Signed)
MRN : [id number]  Crystal Terrell is a 74 y.o. (January 01, 1946) female who presents with chief complaint of  Chief Complaint  Patient presents with  . New Patient (Initial Visit)    ref Rudene Christians discuss ivc filter  .  History of Present Illness:   The patient presents to the office for evaluation of past PE/DVT in association with DJD requiring right hip replacement surgery.  PE/DVT was identified this past December, 2020 and is being treated with anticoagulation.  The presenting symptoms were pleuritic pain and shortness of breath.  The patient notes the leg is doing fine.  Symptoms are better with elevation.  The patient notes minimal edema in the morning which steadily worsens throughout the day.    The patient has not been using compression therapy at this point.  No new SOB or pleuritic chest pains.  No cough or hemoptysis.  No blood per rectum or blood in any sputum.  No excessive bruising per the patient.     Current Meds  Medication Sig  . allopurinol (ZYLOPRIM) 100 MG tablet Take 200 mg by mouth daily.   Marland Kitchen amLODipine (NORVASC) 10 MG tablet Take 10 mg by mouth daily.  Marland Kitchen apixaban (ELIQUIS) 5 MG TABS tablet Take 2 tablets (10 mg total) by mouth 2 (two) times daily. And from 12/27/19 take 5 mg (1 tab) two times a day  . Dulaglutide 1.5 MG/0.5ML SOPN Inject 1.5 mg into the skin once a week.  . fenofibrate 160 MG tablet Take 160 mg by mouth daily.  . furosemide (LASIX) 40 MG tablet Take 40 mg by mouth.  Marland Kitchen glimepiride (AMARYL) 4 MG tablet Take 1 tablet (4 mg total) by mouth daily with breakfast.  . losartan (COZAAR) 100 MG tablet Take 100 mg by mouth daily.  Marland Kitchen lovastatin (MEVACOR) 20 MG tablet Take 20 mg by mouth daily.  . metoprolol succinate (TOPROL-XL) 100 MG 24 hr tablet Take 100 mg by mouth daily. Take with or immediately following a meal.  . naproxen sodium (ALEVE) 220 MG tablet Take 220 mg by mouth daily as needed (pain).   . Omega-3 1000 MG CAPS Take 1,000 mg by mouth 2 (two)  times daily.    Past Medical History:  Diagnosis Date  . Adenoma of rectum   . Anemia   . Diabetes mellitus without complication ([place])   . Diverticulosis   . Ganglion cyst    right  . Gout   . Hypertension   . Mixed hyperlipidemia   . Morbid obesity with BMI of 40.0-44.9, adult ([place])   . Venous insufficiency of lower extremity     Past Surgical History:  Procedure Laterality Date  . ABDOMINAL HYSTERECTOMY    . COLONOSCOPY    . COLONOSCOPY WITH PROPOFOL N/A 11/07/2019   Procedure: COLONOSCOPY WITH PROPOFOL;  Surgeon: Toledo, Benay Pike, MD;  Location: [facility] ENDOSCOPY;  Service: Gastroenterology;  Laterality: N/A;  . DIAGNOSTIC LAPAROSCOPY    . HYSTEROSCOPY WITH D & C    . TUBAL LIGATION      Social History Social History   Tobacco Use  . Smoking status: Never Smoker  . Smokeless tobacco: Never Used  Vaping Use  . Vaping Use: Never used  Substance Use Topics  . Alcohol use: Not Currently  . Drug use: Never    Family History Family History  Problem Relation Age of Onset  . Breast cancer Neg Hx   No family history of bleeding/clotting disorders, porphyria or autoimmune disease  Allergies  Allergen Reactions  . Metformin     Other reaction(s): Other (See Comments) Makes her feel sick  . Metformin And Related   . Sulfa Antibiotics Nausea Only     REVIEW OF SYSTEMS (Negative unless checked)  Constitutional: [] Weight loss  [] Fever  [] Chills Cardiac: [] Chest pain   [] Chest pressure   [] Palpitations   [] Shortness of breath when laying flat   [] Shortness of breath with exertion. Vascular:  [x] Pain in legs with walking   [] Pain in legs at rest  [x] History of DVT   [] Phlebitis   [] Swelling in legs   [] Varicose veins   [] Non-healing ulcers Pulmonary:   [] Uses home oxygen   [] Productive cough   [] Hemoptysis   [] Wheeze  [] COPD   [] Asthma Neurologic:  [] Dizziness   [] Seizures   [] History of stroke   [] History of TIA  [] Aphasia   [] Vissual changes   [] Weakness or  numbness in arm   [x] Weakness or numbness in leg Musculoskeletal:   [x] Joint swelling   [x] Joint pain   [] Low back pain Hematologic:  [] Easy bruising  [] Easy bleeding   [] Hypercoagulable state   [] Anemic Gastrointestinal:  [] Diarrhea   [] Vomiting  [] Gastroesophageal reflux/heartburn   [] Difficulty swallowing. Genitourinary:  [] Chronic kidney disease   [] Difficult urination  [] Frequent urination   [] Blood in urine Skin:  [] Rashes   [] Ulcers  Psychological:  [] History of anxiety   []  History of major depression.  Physical Examination  Vitals:   07/20/20 0959  BP: 114/67  Pulse: 58  Resp: 16  Weight: (!) 215 lb (97.5 kg)  Height: 5\' 2"  (1.575 m)   Body mass index is 39.32 kg/m. Gen: WD/WN, NAD Head: [place]/AT, No temporalis wasting.  Ear/Nose/Throat: Hearing grossly intact, nares w/o erythema or drainage, poor dentition Eyes: PER, EOMI, sclera nonicteric.  Neck: Supple, no masses.  No bruit or JVD.  Pulmonary:  Good air movement, clear to auscultation bilaterally, no use of accessory muscles.  Cardiac: RRR, normal S1, S2, no Murmurs. Vascular: scattered varicosities present bilaterally.  Mild venous stasis changes to the legs bilaterally.  1+ soft pitting edema Vessel Right Left  Radial Palpable Palpable  Gastrointestinal: soft, non-distended. No guarding/no peritoneal signs.  Musculoskeletal: M/S 5/5 throughout.  No deformity or atrophy.  Neurologic: CN 2-12 intact. Pain and light touch intact in extremities.  Symmetrical.  Speech is fluent. Motor exam as listed above. Psychiatric: Judgment intact, Mood & affect appropriate for pt's clinical situation. Dermatologic: No rashes or ulcers noted.  No changes consistent with cellulitis. Lymph : No lichenification or skin changes of chronic lymphedema.  CBC Lab Results  Component Value Date   WBC 7.2 12/19/2019   HGB 11.2 (L) 12/19/2019   HCT 35.6 (L) 12/19/2019   MCV 84.8 12/19/2019   PLT 158 12/19/2019    BMET    Component  Value Date/Time   NA 144 12/19/2019 0241   K 3.8 12/19/2019 0241   CL 106 12/19/2019 0241   CO2 27 12/19/2019 0241   GLUCOSE 121 (H) 12/19/2019 0241   BUN 26 (H) 12/19/2019 0241   CREATININE 1.15 (H) 12/19/2019 0241   CALCIUM 9.6 12/19/2019 0241   GFRNONAA 47 (L) 12/19/2019 0241   GFRAA 54 (L) 12/19/2019 0241   CrCl cannot be calculated (Patient's most recent lab result is older than the maximum 21 days allowed.).  COAG Lab Results  Component Value Date   INR 1.0 12/18/2019    Radiology No results found.    Assessment/Plan 1. Chronic pulmonary embolism, unspecified  pulmonary embolism type, unspecified whether acute cor pulmonale present [facility]) The patient will continue anticoagulation for now as there have not been any problems or complications at this point.  IVC filter is strongly indicated prior to high risk orthopedic surgery.  Especially given the history of PE with the past DVT.  IVC filter placement will be done the week for surgery. Risk and benefits were reviewed the patient.  Indications for the procedure were reviewed.  All questions were answered, the patient agrees to proceed.   I have had a long discussion with the patient regarding DVT and post phlebitic changes such as swelling and why it  causes symptoms such as pain.  The patient will wear graduated compression stockings class 1 (20-30 mmHg) on a daily basis a prescription was given. The patient will  beginning wearing the stockings first thing in the morning and removing them in the evening. The patient is instructed specifically not to sleep in the stockings.  In addition, behavioral modification including elevation during the day and avoidance of prolonged dependency will be initiated.    The patient will follow-up with me in 3 months after the joint replacement surgery to discuss removal (this was also discussed today and the patient agrees with the plan to have the filter removed).   2. Chronic right hip  pain Patient will undergo right THR  3. Essential hypertension Continue antihypertensive medications as already ordered, these medications have been reviewed and there are no changes at this time.   4. Type 2 diabetes mellitus without complication, without long-term current use of insulin (HCC) Continue hypoglycemic medications as already ordered, these medications have been reviewed and there are no changes at this time.  Hgb A1C to be monitored as already arranged by primary service   5. Mixed hyperlipidemia Continue statin as ordered and reviewed, no changes at this time     [name], MD  07/20/2020 11:09 AM

## 2020-07-20 NOTE — H&P (View-Only) (Signed)
MRN : [id number]  Crystal Terrell is a 74 y.o. (1946-10-09) female who presents with chief complaint of  Chief Complaint  Patient presents with  . New Patient (Initial Visit)    ref Rudene Christians discuss ivc filter  .  History of Present Illness:   The patient presents to the office for evaluation of past PE/DVT in association with DJD requiring right hip replacement surgery.  PE/DVT was identified this past December, 2020 and is being treated with anticoagulation.  The presenting symptoms were pleuritic pain and shortness of breath.  The patient notes the leg is doing fine.  Symptoms are better with elevation.  The patient notes minimal edema in the morning which steadily worsens throughout the day.    The patient has not been using compression therapy at this point.  No new SOB or pleuritic chest pains.  No cough or hemoptysis.  No blood per rectum or blood in any sputum.  No excessive bruising per the patient.     Current Meds  Medication Sig  . allopurinol (ZYLOPRIM) 100 MG tablet Take 200 mg by mouth daily.   Marland Kitchen amLODipine (NORVASC) 10 MG tablet Take 10 mg by mouth daily.  Marland Kitchen apixaban (ELIQUIS) 5 MG TABS tablet Take 2 tablets (10 mg total) by mouth 2 (two) times daily. And from 12/27/19 take 5 mg (1 tab) two times a day  . Dulaglutide 1.5 MG/0.5ML SOPN Inject 1.5 mg into the skin once a week.  . fenofibrate 160 MG tablet Take 160 mg by mouth daily.  . furosemide (LASIX) 40 MG tablet Take 40 mg by mouth.  Marland Kitchen glimepiride (AMARYL) 4 MG tablet Take 1 tablet (4 mg total) by mouth daily with breakfast.  . losartan (COZAAR) 100 MG tablet Take 100 mg by mouth daily.  Marland Kitchen lovastatin (MEVACOR) 20 MG tablet Take 20 mg by mouth daily.  . metoprolol succinate (TOPROL-XL) 100 MG 24 hr tablet Take 100 mg by mouth daily. Take with or immediately following a meal.  . naproxen sodium (ALEVE) 220 MG tablet Take 220 mg by mouth daily as needed (pain).   . Omega-3 1000 MG CAPS Take 1,000 mg by mouth 2 (two)  times daily.    Past Medical History:  Diagnosis Date  . Adenoma of rectum   . Anemia   . Diabetes mellitus without complication ([place])   . Diverticulosis   . Ganglion cyst    right  . Gout   . Hypertension   . Mixed hyperlipidemia   . Morbid obesity with BMI of 40.0-44.9, adult ([place])   . Venous insufficiency of lower extremity     Past Surgical History:  Procedure Laterality Date  . ABDOMINAL HYSTERECTOMY    . COLONOSCOPY    . COLONOSCOPY WITH PROPOFOL N/A 11/07/2019   Procedure: COLONOSCOPY WITH PROPOFOL;  Surgeon: Toledo, Benay Pike, MD;  Location: [facility] ENDOSCOPY;  Service: Gastroenterology;  Laterality: N/A;  . DIAGNOSTIC LAPAROSCOPY    . HYSTEROSCOPY WITH D & C    . TUBAL LIGATION      Social History Social History   Tobacco Use  . Smoking status: Never Smoker  . Smokeless tobacco: Never Used  Vaping Use  . Vaping Use: Never used  Substance Use Topics  . Alcohol use: Not Currently  . Drug use: Never    Family History Family History  Problem Relation Age of Onset  . Breast cancer Neg Hx   No family history of bleeding/clotting disorders, porphyria or autoimmune disease  Allergies  Allergen Reactions  . Metformin     Other reaction(s): Other (See Comments) Makes her feel sick  . Metformin And Related   . Sulfa Antibiotics Nausea Only     REVIEW OF SYSTEMS (Negative unless checked)  Constitutional: [] Weight loss  [] Fever  [] Chills Cardiac: [] Chest pain   [] Chest pressure   [] Palpitations   [] Shortness of breath when laying flat   [] Shortness of breath with exertion. Vascular:  [x] Pain in legs with walking   [] Pain in legs at rest  [x] History of DVT   [] Phlebitis   [] Swelling in legs   [] Varicose veins   [] Non-healing ulcers Pulmonary:   [] Uses home oxygen   [] Productive cough   [] Hemoptysis   [] Wheeze  [] COPD   [] Asthma Neurologic:  [] Dizziness   [] Seizures   [] History of stroke   [] History of TIA  [] Aphasia   [] Vissual changes   [] Weakness or  numbness in arm   [x] Weakness or numbness in leg Musculoskeletal:   [x] Joint swelling   [x] Joint pain   [] Low back pain Hematologic:  [] Easy bruising  [] Easy bleeding   [] Hypercoagulable state   [] Anemic Gastrointestinal:  [] Diarrhea   [] Vomiting  [] Gastroesophageal reflux/heartburn   [] Difficulty swallowing. Genitourinary:  [] Chronic kidney disease   [] Difficult urination  [] Frequent urination   [] Blood in urine Skin:  [] Rashes   [] Ulcers  Psychological:  [] History of anxiety   []  History of major depression.  Physical Examination  Vitals:   07/20/20 0959  BP: 114/67  Pulse: 58  Resp: 16  Weight: (!) 215 lb (97.5 kg)  Height: 5\' 2"  (1.575 m)   Body mass index is 39.32 kg/m. Gen: WD/WN, NAD Head: [place]/AT, No temporalis wasting.  Ear/Nose/Throat: Hearing grossly intact, nares w/o erythema or drainage, poor dentition Eyes: PER, EOMI, sclera nonicteric.  Neck: Supple, no masses.  No bruit or JVD.  Pulmonary:  Good air movement, clear to auscultation bilaterally, no use of accessory muscles.  Cardiac: RRR, normal S1, S2, no Murmurs. Vascular: scattered varicosities present bilaterally.  Mild venous stasis changes to the legs bilaterally.  1+ soft pitting edema Vessel Right Left  Radial Palpable Palpable  Gastrointestinal: soft, non-distended. No guarding/no peritoneal signs.  Musculoskeletal: M/S 5/5 throughout.  No deformity or atrophy.  Neurologic: CN 2-12 intact. Pain and light touch intact in extremities.  Symmetrical.  Speech is fluent. Motor exam as listed above. Psychiatric: Judgment intact, Mood & affect appropriate for pt's clinical situation. Dermatologic: No rashes or ulcers noted.  No changes consistent with cellulitis. Lymph : No lichenification or skin changes of chronic lymphedema.  CBC Lab Results  Component Value Date   WBC 7.2 12/19/2019   HGB 11.2 (L) 12/19/2019   HCT 35.6 (L) 12/19/2019   MCV 84.8 12/19/2019   PLT 158 12/19/2019    BMET    Component  Value Date/Time   NA 144 12/19/2019 0241   K 3.8 12/19/2019 0241   CL 106 12/19/2019 0241   CO2 27 12/19/2019 0241   GLUCOSE 121 (H) 12/19/2019 0241   BUN 26 (H) 12/19/2019 0241   CREATININE 1.15 (H) 12/19/2019 0241   CALCIUM 9.6 12/19/2019 0241   GFRNONAA 47 (L) 12/19/2019 0241   GFRAA 54 (L) 12/19/2019 0241   CrCl cannot be calculated (Patient's most recent lab result is older than the maximum 21 days allowed.).  COAG Lab Results  Component Value Date   INR 1.0 12/18/2019    Radiology No results found.    Assessment/Plan 1. Chronic pulmonary embolism, unspecified  pulmonary embolism type, unspecified whether acute cor pulmonale present [facility]) The patient will continue anticoagulation for now as there have not been any problems or complications at this point.  IVC filter is strongly indicated prior to high risk orthopedic surgery.  Especially given the history of PE with the past DVT.  IVC filter placement will be done the week for surgery. Risk and benefits were reviewed the patient.  Indications for the procedure were reviewed.  All questions were answered, the patient agrees to proceed.   I have had a long discussion with the patient regarding DVT and post phlebitic changes such as swelling and why it  causes symptoms such as pain.  The patient will wear graduated compression stockings class 1 (20-30 mmHg) on a daily basis a prescription was given. The patient will  beginning wearing the stockings first thing in the morning and removing them in the evening. The patient is instructed specifically not to sleep in the stockings.  In addition, behavioral modification including elevation during the day and avoidance of prolonged dependency will be initiated.    The patient will follow-up with me in 3 months after the joint replacement surgery to discuss removal (this was also discussed today and the patient agrees with the plan to have the filter removed).   2. Chronic right hip  pain Patient will undergo right THR  3. Essential hypertension Continue antihypertensive medications as already ordered, these medications have been reviewed and there are no changes at this time.   4. Type 2 diabetes mellitus without complication, without long-term current use of insulin (HCC) Continue hypoglycemic medications as already ordered, these medications have been reviewed and there are no changes at this time.  Hgb A1C to be monitored as already arranged by primary service   5. Mixed hyperlipidemia Continue statin as ordered and reviewed, no changes at this time     [name], MD  07/20/2020 11:09 AM

## 2020-07-20 NOTE — Telephone Encounter (Signed)
Spoke with the patient and she is scheduled with Dr. Delana Meyer for angio on 07/21/20 with a 9:00 am arrival time to the [facility]. Covid testing today 07/20/20 before 11:00 am at the [facility]. Pre-procedure instructions were discussed and patient will stop by after the covid test to pick up the pre-procedure instructions.

## 2020-07-21 ENCOUNTER — Encounter
Admission: RE | Disposition: A | Discharge status: Home / Self Care | Payer: Self-pay | Source: Home / Self Care | Attending: Vascular Surgery

## 2020-07-21 ENCOUNTER — Ambulatory Visit
Admission: RE | Admit: 2020-07-21 | Discharge: 2020-07-21 | Disposition: A | Discharge status: Home / Self Care | Payer: Medicare Other | Attending: Vascular Surgery | Admitting: Vascular Surgery

## 2020-07-21 ENCOUNTER — Other Ambulatory Visit: Payer: Self-pay

## 2020-07-21 ENCOUNTER — Encounter: Payer: Self-pay | Admitting: Vascular Surgery

## 2020-07-21 DIAGNOSIS — Z6839 Body mass index (BMI) 39.0-39.9, adult: Secondary | ICD-10-CM | POA: Diagnosis not present

## 2020-07-21 DIAGNOSIS — K5790 Diverticulosis of intestine, part unspecified, without perforation or abscess without bleeding: Secondary | ICD-10-CM | POA: Insufficient documentation

## 2020-07-21 DIAGNOSIS — I2782 Chronic pulmonary embolism: Secondary | ICD-10-CM | POA: Insufficient documentation

## 2020-07-21 DIAGNOSIS — Z7984 Long term (current) use of oral hypoglycemic drugs: Secondary | ICD-10-CM | POA: Insufficient documentation

## 2020-07-21 DIAGNOSIS — Z86718 Personal history of other venous thrombosis and embolism: Secondary | ICD-10-CM

## 2020-07-21 DIAGNOSIS — Z7901 Long term (current) use of anticoagulants: Secondary | ICD-10-CM | POA: Insufficient documentation

## 2020-07-21 DIAGNOSIS — Z86711 Personal history of pulmonary embolism: Secondary | ICD-10-CM

## 2020-07-21 DIAGNOSIS — I10 Essential (primary) hypertension: Secondary | ICD-10-CM | POA: Diagnosis not present

## 2020-07-21 DIAGNOSIS — D649 Anemia, unspecified: Secondary | ICD-10-CM | POA: Diagnosis not present

## 2020-07-21 DIAGNOSIS — E119 Type 2 diabetes mellitus without complications: Secondary | ICD-10-CM | POA: Diagnosis not present

## 2020-07-21 DIAGNOSIS — M109 Gout, unspecified: Secondary | ICD-10-CM | POA: Insufficient documentation

## 2020-07-21 DIAGNOSIS — I82409 Acute embolism and thrombosis of unspecified deep veins of unspecified lower extremity: Secondary | ICD-10-CM

## 2020-07-21 DIAGNOSIS — E782 Mixed hyperlipidemia: Secondary | ICD-10-CM | POA: Insufficient documentation

## 2020-07-21 DIAGNOSIS — Z79899 Other long term (current) drug therapy: Secondary | ICD-10-CM | POA: Diagnosis not present

## 2020-07-21 DIAGNOSIS — M25551 Pain in right hip: Secondary | ICD-10-CM | POA: Insufficient documentation

## 2020-07-21 DIAGNOSIS — I872 Venous insufficiency (chronic) (peripheral): Secondary | ICD-10-CM | POA: Diagnosis not present

## 2020-07-21 DIAGNOSIS — G8929 Other chronic pain: Secondary | ICD-10-CM | POA: Diagnosis not present

## 2020-07-21 HISTORY — PX: IVC FILTER INSERTION: CATH118245

## 2020-07-21 LAB — GLUCOSE, CAPILLARY: Glucose-Capillary: 151 mg/dL — ABNORMAL HIGH (ref 70–99)

## 2020-07-21 LAB — SARS CORONAVIRUS 2 (TAT 6-24 HRS): SARS Coronavirus 2: NEGATIVE

## 2020-07-21 SURGERY — IVC FILTER INSERTION
Anesthesia: Moderate Sedation

## 2020-07-21 MED ORDER — CEFAZOLIN SODIUM-DEXTROSE 2-4 GM/100ML-% IV SOLN: 2.0000 g | Freq: Once | INTRAVENOUS | Status: AC

## 2020-07-21 MED ORDER — MIDAZOLAM HCL 5 MG/5ML IJ SOLN
INTRAMUSCULAR | Status: AC
  Filled 2020-07-21: qty 5

## 2020-07-21 MED ORDER — IODIXANOL 320 MG/ML IV SOLN
INTRAVENOUS | Status: DC | PRN
  Administered 2020-07-21: 15 mL via INTRAVENOUS

## 2020-07-21 MED ORDER — FAMOTIDINE 20 MG PO TABS: 40.0000 mg | ORAL_TABLET | Freq: Once | ORAL | Status: DC | PRN

## 2020-07-21 MED ORDER — MIDAZOLAM HCL 2 MG/2ML IJ SOLN
INTRAMUSCULAR | Status: DC | PRN
  Administered 2020-07-21: 2 mg via INTRAVENOUS
  Administered 2020-07-21: 1 mg via INTRAVENOUS

## 2020-07-21 MED ORDER — DIPHENHYDRAMINE HCL 50 MG/ML IJ SOLN: 50.0000 mg | Freq: Once | INTRAMUSCULAR | Status: DC | PRN

## 2020-07-21 MED ORDER — METHYLPREDNISOLONE SODIUM SUCC 125 MG IJ SOLR: 125.0000 mg | Freq: Once | INTRAMUSCULAR | Status: DC | PRN

## 2020-07-21 MED ORDER — FENTANYL CITRATE (PF) 100 MCG/2ML IJ SOLN
INTRAMUSCULAR | Status: AC
  Filled 2020-07-21: qty 2

## 2020-07-21 MED ORDER — MIDAZOLAM HCL 2 MG/ML PO SYRP: 8.0000 mg | ORAL_SOLUTION | Freq: Once | ORAL | Status: DC | PRN

## 2020-07-21 MED ORDER — HYDROMORPHONE HCL 1 MG/ML IJ SOLN: 1.0000 mg | Freq: Once | INTRAMUSCULAR | Status: DC | PRN

## 2020-07-21 MED ORDER — ONDANSETRON HCL 4 MG/2ML IJ SOLN: 4.0000 mg | Freq: Four times a day (QID) | INTRAMUSCULAR | Status: DC | PRN

## 2020-07-21 MED ORDER — CEFAZOLIN SODIUM-DEXTROSE 2-4 GM/100ML-% IV SOLN
INTRAVENOUS | Status: AC
  Administered 2020-07-21: 2 g via INTRAVENOUS
  Filled 2020-07-21: qty 100

## 2020-07-21 MED ORDER — FENTANYL CITRATE (PF) 100 MCG/2ML IJ SOLN
INTRAMUSCULAR | Status: DC | PRN
  Administered 2020-07-21 (×2): 50 ug via INTRAVENOUS

## 2020-07-21 MED ORDER — SODIUM CHLORIDE 0.9 % IV SOLN: INTRAVENOUS | Status: DC

## 2020-07-21 SURGICAL SUPPLY — 3 items
KIT FEMORAL DEL DENALI (Miscellaneous) ×3 IMPLANT
PACK ANGIOGRAPHY (CUSTOM PROCEDURE TRAY) ×3 IMPLANT
WIRE J 3MM .035X145CM (WIRE) ×3 IMPLANT

## 2020-07-21 NOTE — Op Note (Signed)
[facility]   OPERATIVE NOTE    PRE-OPERATIVE DIAGNOSIS: DVT with PE  POST-OPERATIVE DIAGNOSIS: Same  PROCEDURE: 1.   Ultrasound guidance for vascular access to the right common femoral vein 2.   Catheter placement into the inferior vena cava 3.   Inferior venacavogram 4.   Placement of a Denali IVC filter  SURGEON: [name]  ASSISTANT(S): None  ANESTHESIA: Conscious sedation was administered by the interventional radiology RN under my direct supervision. IV Versed plus fentanyl were utilized. Continuous ECG, pulse oximetry and blood pressure was monitored throughout the entire procedure. Conscious sedation was for a total of 20 minutes.  ESTIMATED BLOOD LOSS: minimal  FINDING(S): 1.  Patent IVC  SPECIMEN(S):  none  INDICATIONS:   [name] is a 74 y.o. y.o. female who presents with worsening right hip pain now requiring total hip replacement.  Approximately 6 months ago she experienced a spontaneous PE.  Tibial DVT was identified as well.  She has been initiated on Eliquis and has been tolerating anticoagulation since then, given the need for joint replacement surgery inferior vena cava filter is indicated for this reason.  Risks and benefits including filter thrombosis, migration, fracture, bleeding, and infection were all discussed.  We discussed that all IVC filters that we place can be removed if desired from the patient once the need for the filter has passed.    DESCRIPTION: After obtaining full informed written consent, the patient was brought back to the vascular suite. The skin was sterilely prepped and draped in a sterile surgical field was created. Ultrasound was placed in a sterile sleeve. The right common femoral vein was echolucent and compressible indicating patency. Image was recorded for the permanent record. The puncture was made under continuous real-time ultrasound guidance.  The right common femoral vein was accessed under  direct ultrasound guidance without difficulty with a micropuncture needle. Microwire was then advanced under fluoroscopic guidance without difficulty. Micro-sheath was then inserted and a J-wire was then placed. The dilator is passed over the wire and the delivery sheath was placed into the inferior vena cava.  Inferior venacavogram was performed. This demonstrated a patent IVC with the level of the renal veins at L2.  The filter was then deployed into the inferior vena cava at the level of L3 just below the renal veins. The delivery sheath was then removed. Pressure was held. Sterile dressings were placed. The patient tolerated the procedure well and was taken to the recovery room in stable condition.  Interpretation: Inferior vena cava is opacified with bolus injection contrast.  Renal blushes are noted at L2.  Cava measures approximately 20 mm in diameter.  Denali filter is deployed at the level of L3 in excellent orientation  COMPLICATIONS: None  CONDITION: Stable  [name]  07/21/2020, 10:50 AM

## 2020-07-21 NOTE — H&P (Signed)
[facility] History & Physical Update  The patient was interviewed and re-examined.  The patient's previous History and Physical has been reviewed and is unchanged.  There is no change in the plan of care. We plan to proceed with the scheduled procedure.  [name], MD  07/21/2020, 10:08 AM

## 2020-07-22 ENCOUNTER — Other Ambulatory Visit: Payer: Self-pay

## 2020-07-22 ENCOUNTER — Encounter
Admission: RE | Admit: 2020-07-22 | Discharge: 2020-07-22 | Disposition: A | Discharge status: Home / Self Care | Payer: Medicare Other | Source: Ambulatory Visit | Attending: Orthopedic Surgery | Admitting: Orthopedic Surgery

## 2020-07-22 DIAGNOSIS — Z01818 Encounter for other preprocedural examination: Secondary | ICD-10-CM | POA: Insufficient documentation

## 2020-07-22 DIAGNOSIS — Z01810 Encounter for preprocedural cardiovascular examination: Secondary | ICD-10-CM | POA: Diagnosis not present

## 2020-07-22 LAB — COMPREHENSIVE METABOLIC PANEL
ALT: 12 U/L (ref 0–44)
AST: 20 U/L (ref 15–41)
Albumin: 4.2 g/dL (ref 3.5–5.0)
Alkaline Phosphatase: 46 U/L (ref 38–126)
Anion gap: 9 (ref 5–15)
BUN: 30 mg/dL — ABNORMAL HIGH (ref 8–23)
CO2: 28 mmol/L (ref 22–32)
Calcium: 9.4 mg/dL (ref 8.9–10.3)
Chloride: 106 mmol/L (ref 98–111)
Creatinine, Ser: 1.35 mg/dL — ABNORMAL HIGH (ref 0.44–1.00)
GFR calc Af Amer: 44 mL/min — ABNORMAL LOW (ref >60)
GFR calc non Af Amer: 38 mL/min — ABNORMAL LOW (ref >60)
Glucose, Bld: 145 mg/dL — ABNORMAL HIGH (ref 70–99)
Potassium: 3.8 mmol/L (ref 3.5–5.1)
Sodium: 143 mmol/L (ref 135–145)
Total Bilirubin: 0.8 mg/dL (ref 0.3–1.2)
Total Protein: 7.6 g/dL (ref 6.5–8.1)

## 2020-07-22 LAB — URINALYSIS, ROUTINE W REFLEX MICROSCOPIC
Bilirubin Urine: NEGATIVE
Glucose, UA: NEGATIVE mg/dL
Hgb urine dipstick: NEGATIVE
Ketones, ur: NEGATIVE mg/dL
Leukocytes,Ua: NEGATIVE
Nitrite: NEGATIVE
Protein, ur: NEGATIVE mg/dL
Specific Gravity, Urine: 1.012 (ref 1.005–1.030)
pH: 5 (ref 5.0–8.0)

## 2020-07-22 LAB — CBC WITH DIFFERENTIAL/PLATELET
Abs Immature Granulocytes: 0 10*3/uL (ref 0.00–0.07)
Basophils Absolute: 0 10*3/uL (ref 0.0–0.1)
Basophils Relative: 1 %
Eosinophils Absolute: 0 10*3/uL (ref 0.0–0.5)
Eosinophils Relative: 1 %
HCT: 36.3 % (ref 36.0–46.0)
Hemoglobin: 11.5 g/dL — ABNORMAL LOW (ref 12.0–15.0)
Immature Granulocytes: 0 %
Lymphocytes Relative: 50 %
Lymphs Abs: 2.2 10*3/uL (ref 0.7–4.0)
MCH: 27.5 pg (ref 26.0–34.0)
MCHC: 31.7 g/dL (ref 30.0–36.0)
MCV: 86.8 fL (ref 80.0–100.0)
Monocytes Absolute: 0.4 10*3/uL (ref 0.1–1.0)
Monocytes Relative: 9 %
Neutro Abs: 1.8 10*3/uL (ref 1.7–7.7)
Neutrophils Relative %: 39 %
Platelets: 238 10*3/uL (ref 150–400)
RBC: 4.18 MIL/uL (ref 3.87–5.11)
RDW: 13.2 % (ref 11.5–15.5)
WBC: 4.4 10*3/uL (ref 4.0–10.5)
nRBC: 0 % (ref 0.0–0.2)

## 2020-07-22 LAB — SURGICAL PCR SCREEN
MRSA, PCR: NEGATIVE
Staphylococcus aureus: NEGATIVE

## 2020-07-22 NOTE — Patient Instructions (Signed)
Your procedure is scheduled on: Tuesday 07/28/20.  Report to DAY SURGERY DEPARTMENT LOCATED ON 2ND FLOOR MEDICAL MALL ENTRANCE. To find out your arrival time please call [phone] between 1PM - 3PM on Monday 07/27/20.   Remember: Instructions that are not followed completely may result in serious medical risk, up to and including death, or upon the discretion of your surgeon and anesthesiologist your surgery may need to be rescheduled.     __X__ 1. Do not eat food after midnight the night before your procedure.                 No gum chewing or hard candies. You may drink SUGAR FREE clear liquids up to 2 hours                 before you are scheduled to arrive for your surgery- DO NOT drink clear                 liquids within 2 hours of the start of your surgery.                   __X__2.  On the morning of surgery brush your teeth with toothpaste and water, you may rinse your mouth with mouthwash if you wish.  Do not swallow any toothpaste or mouthwash.    __X__ 3.  No Alcohol for 24 hours before or after surgery.  __X__ 4.  Do Not Smoke or use e-cigarettes For 24 Hours Prior to Your Surgery.                 Do not use any chewable tobacco products for at least 6 hours prior to                 surgery.  __X__5.  Notify your doctor if there is any change in your medical condition      (cold, fever, infections).      Do NOT wear jewelry, make-up, hairpins, clips or nail polish. Do NOT wear lotions, powders, or perfumes.  Do NOT shave 48 hours prior to surgery. Men may shave face and neck. Do NOT bring valuables to the hospital.     [facility] is not responsible for any belongings or valuables.   Contacts, dentures/partials or body piercings may not be worn into surgery. Bring a case for your contacts, glasses or hearing aids, a denture cup will be supplied.  Leave your suitcase in the car. After surgery it may be brought to your room.   For patients admitted to the  hospital, discharge time is determined by your treatment team.   __X__ Take these medicines the morning of surgery with A SIP OF WATER:     1. allopurinol (ZYLOPRIM)   2. fenofibrate      __X__ Use CHG Soap as directed   __X__ Stop Eliquis 5 days prior to your surgery. Your last dose will be on Wednesday 07/22/20.   __X__ Stop Anti-inflammatories 7 days before surgery such as Advil, Ibuprofen, Motrin, BC or Goodies Powder, Naprosyn, Naproxen, Aleve, Aspirin, Meloxicam. May take Tylenol if needed for pain or discomfort.   __X__Do not start taking any new herbal supplements or vitamins prior to your procedure.  __X__ Stop the following herbal supplements or vitamins:  Omega-3 1000 MG CAPS   Wear comfortable clothing (specific to your surgery type) to the hospital.  Plan for stool softeners for home use; pain medications have a tendency to cause constipation. You can also  help prevent constipation by eating foods high in fiber such as fruits and vegetables and drinking plenty of fluids as your diet allows.  After surgery, you can prevent lung complications by doing breathing exercises.Take deep breaths and cough every 1-2 hours. Your doctor may order a device called an Incentive Spirometer to help you take deep breaths.  Please call the [facility] Department at [phone] if you have any questions about these instructions

## 2020-07-23 LAB — TYPE AND SCREEN
ABO/RH(D): O POS
Antibody Screen: NEGATIVE

## 2020-07-24 ENCOUNTER — Other Ambulatory Visit
Admission: RE | Admit: 2020-07-24 | Discharge: 2020-07-24 | Disposition: A | Discharge status: Home / Self Care | Payer: Medicare Other | Source: Ambulatory Visit | Attending: Orthopedic Surgery | Admitting: Orthopedic Surgery

## 2020-07-24 ENCOUNTER — Other Ambulatory Visit: Payer: Self-pay

## 2020-07-24 DIAGNOSIS — Z01812 Encounter for preprocedural laboratory examination: Secondary | ICD-10-CM | POA: Insufficient documentation

## 2020-07-24 DIAGNOSIS — Z20822 Contact with and (suspected) exposure to covid-19: Secondary | ICD-10-CM | POA: Diagnosis not present

## 2020-07-24 LAB — SARS CORONAVIRUS 2 (TAT 6-24 HRS): SARS Coronavirus 2: NEGATIVE

## 2020-07-27 NOTE — H&P (Signed)
Chief Complaint  Patient presents with  . Establish Care  Rt Hip pain   History of the Present Illness: [name] is a 74 y.o. female here for evaluation of chronic right hip pain. She had x-rays on 06/24/2020 from obtained by Dion Body, MD that show severe right hip and fairly significant left hip osteoarthritis. She has complete loss of the joint space, abnormal femoral head with some flattening and extensive osteophytes, subchondral cyst formation. She now comes in for further review.  The patient states she has a dull ache in her right hip that wakes her at night if she rolls over. She states she can walk up and down every isle at the store, but she holds on to a cart.  The patient's A1c is 7.6. She states she has had a blood clot in her left leg in 12/2019. She takes Eliquis.  The patient is not working.  I have reviewed past medical, surgical, social and family history, and allergies as documented in the EMR.  Past Medical History: Past Medical History:  Diagnosis Date  . Adenoma of rectum  ascending colon - 08/12/2005-07/06/2009 ([place]/[facility])  . Diabetes mellitus type 2, uncomplicated (CMS-HCC)  diet controlled  . Diverticulosis 08/19/14  . Essential hypertension  . History of anemia  . History of gout  . Mixed hyperlipidemia  . Morbid obesity with BMI of 40.0-44.9, adult (CMS-HCC)  . Venous insufficiency of lower extremity  09/10/2008 Delana Meyer)   Past Surgical History: Past Surgical History:  Procedure Laterality Date  . COLONOSCOPY 07/06/09  tubular adenoma - due 06/26/14  . COLONOSCOPY 08/19/14  repeat 5 years per [facility]  . COLONOSCOPY 11/07/2019  Diverticulosis/Otherwise normal colon/PHx CP/No Repeat due to age/TKT  . ENDOSCOPIC CARPAL TUNNEL RELEASE Right  . HYSTERECTOMY  . hysteroscopy and D&C 02/07/11  for postmenopausal bleeding (Dr. Enzo Bi)  . laparoscopy and exploratory laparotomy 08/01/11  with supracervical hysterectomy and BSO (for  post-menopausal bleeding and hx of simple and complex endometrial hyperplasia without atypia)  . Right ganglion cyst  . TUBAL LIGATION   Past Family History: Family History  Problem Relation Age of Onset  . High blood pressure (Hypertension) Mother  . Stroke Mother  . Kidney disease Father  . Coronary Artery Disease (Blocked arteries around heart) Sister  . Colon cancer Neg Hx  . Colon polyps Neg Hx  . Rectal cancer Neg Hx  . Liver disease Neg Hx  . Ulcers Neg Hx   Medications: Current Outpatient Medications Ordered in Epic  Medication Sig Dispense Refill  . allopurinoL (ZYLOPRIM) 100 MG tablet Take 2 tablets (200 mg total) by mouth once daily 180 tablet 1  . amLODIPine (NORVASC) 10 MG tablet Take 1 tablet (10 mg total) by mouth once daily 90 tablet 1  . apixaban (ELIQUIS) 5 mg tablet Take 1 tablet (5 mg total) by mouth every 12 (twelve) hours 180 tablet 1  . dulaglutide (TRULICITY) 1.5 PR/9.1 mL subcutaneous injection Inject 0.5 mLs (1.5 mg total) subcutaneously every 7 (seven) days 7 mL 1  . fenofibrate 160 MG tablet Take 1 tablet (160 mg total) by mouth once daily 90 tablet 1  . FUROsemide (LASIX) 40 MG tablet Take 1 tablet (40 mg total) by mouth once daily 90 tablet 1  . glimepiride (AMARYL) 4 MG tablet Take 1 tablet (4 mg total) by mouth 2 (two) times daily with meals 180 tablet 1  . lancets Check blood sugar once daily fasting. Use as instructed. Dx E11.69 ONE TOUCH 100 each  1  . losartan (COZAAR) 100 MG tablet Take 1 tablet (100 mg total) by mouth once daily 90 tablet 1  . lovastatin (MEVACOR) 20 MG tablet Take 1 tablet (20 mg total) by mouth once daily 90 tablet 1  . metoprolol succinate (TOPROL-XL) 100 MG XL tablet Take 1 tablet (100 mg total) by mouth once daily 90 tablet 1  . omega-3 fatty acids/fish oil 340-1,000 mg capsule Take 1 capsule by mouth 2 (two) times daily.  Marland Kitchen UNABLE TO FIND Aleve x cream  . blood glucose diagnostic test strip Check blood sugar once daily  fasting. Use as instructed. Dx E11.69 ONE TOUCH (Patient not taking: Reported on 07/01/2020 ) 100 each 1  . blood glucose meter kit Use as directed Check blood sugar once daily fasting. Use as instructed. Dx E11.69 ONE TOUCH (Patient not taking: Reported on 07/01/2020 ) 1 each 0   No current Epic-ordered facility-administered medications on file.   Allergies: Allergies  Allergen Reactions  . Metformin Other (See Comments)  Makes her feel sick  . Sulfa (Sulfonamide Antibiotics) Nausea    Body mass index is 37.87 kg/m.  Review of Systems: A comprehensive 14 point ROS was performed, reviewed, and the pertinent orthopaedic findings are documented in the HPI.  Vitals:  07/01/20 1110  BP: 114/68   General Physical Examination:  General/Constitutional: No apparent distress: well-nourished and well developed. Eyes: Pupils equal, round with synchronous movement. Lungs: Clear to auscultation HEENT: Normal Vascular: No edema, swelling or tenderness, except as noted in detailed exam. Cardiac: Heart rate and rhythm is regular. Integumentary: No impressive skin lesions present, except as noted in detailed exam. Neuro/Psych: Normal mood and affect, oriented to person, place and time.  Musculoskeletal Examination: On exam, antalgic gait. Left hip internal rotation is 15 degrees, external is 30 degrees. Right hip internal rotation is 0 degrees, external is 20 degrees. Severe antalgic gait. Lungs are clear. Heart rate and rhythm is normal. HEENT is remarkable for full lower partial upper plate.   Radiographs: No new imaging studies were obtained or reviewed today.  Assessment: ICD-10-CM  1. Primary localized osteoarthritis of right hip M16.11   Plan: The patient has clinical findings of severe right hip osteoarthritis and fairly significant left hip osteoarthritis.  We discussed the patient 's prior x-ray findings. I explained she has severe central arthritis in her right hip. She also  has severe osteoarthritis of her left hip as well. Recommendation is for a right hip arthroplasty, and I explained the procedure and postoperative course in detail using the bone model. She will be on a walker for 2 weeks and then a cane for 2 weeks. I explained she will need someone to help her with her meals after surgery. She will need an vena cava filter due to her prior blood clot.   The patient will be scheduled for right total hip arthroplasty after she is receives her filter and is cleared.   Surgical Risks:  The nature of the condition and the proposed procedure has been reviewed in detail with the patient. Surgical versus non-surgical options and prognosis for recovery have been reviewed and the inherent risks and benefits of each have been discussed including the risks of infection, bleeding, injury to nerves/blood vessels/tendons, incomplete relief of symptoms, persisting pain and/or stiffness, loss of function, complex regional pain syndrome, failure of the procedure, as appropriate.  Teeth: Full lower, partial upper plate and plan on communicated with filter.  Scribe Attestation: I, Dawn Royse, am acting as  scribe for Lauris Poag, MD    Electronically signed by Lauris Poag, MD at 07/01/2020 6:42 PM EDT  Reviewed paper H+P, will be scanned into chart. No changes noted.

## 2020-07-28 ENCOUNTER — Encounter
Admission: RE | Disposition: A | Discharge status: Home Health | Payer: Self-pay | Source: Home / Self Care | Attending: Orthopedic Surgery

## 2020-07-28 ENCOUNTER — Inpatient Hospital Stay: Payer: Medicare Other | Admitting: Urgent Care

## 2020-07-28 ENCOUNTER — Ambulatory Visit: Payer: Medicare Other

## 2020-07-28 ENCOUNTER — Inpatient Hospital Stay
Admission: RE | Admit: 2020-07-28 | Discharge: 2020-07-30 | DRG: 470 | Disposition: A | Discharge status: Home Health | Payer: Medicare Other | Attending: Orthopedic Surgery | Admitting: Orthopedic Surgery

## 2020-07-28 ENCOUNTER — Encounter: Payer: Self-pay | Admitting: Orthopedic Surgery

## 2020-07-28 ENCOUNTER — Inpatient Hospital Stay: Payer: Medicare Other

## 2020-07-28 ENCOUNTER — Inpatient Hospital Stay: Payer: Medicare Other | Admitting: Certified Registered"

## 2020-07-28 ENCOUNTER — Other Ambulatory Visit: Payer: Self-pay

## 2020-07-28 DIAGNOSIS — Z86718 Personal history of other venous thrombosis and embolism: Secondary | ICD-10-CM | POA: Diagnosis not present

## 2020-07-28 DIAGNOSIS — I129 Hypertensive chronic kidney disease with stage 1 through stage 4 chronic kidney disease, or unspecified chronic kidney disease: Secondary | ICD-10-CM | POA: Diagnosis present

## 2020-07-28 DIAGNOSIS — M1611 Unilateral primary osteoarthritis, right hip: Secondary | ICD-10-CM | POA: Diagnosis present

## 2020-07-28 DIAGNOSIS — Z96649 Presence of unspecified artificial hip joint: Secondary | ICD-10-CM

## 2020-07-28 DIAGNOSIS — Z882 Allergy status to sulfonamides status: Secondary | ICD-10-CM

## 2020-07-28 DIAGNOSIS — Z79899 Other long term (current) drug therapy: Secondary | ICD-10-CM

## 2020-07-28 DIAGNOSIS — N1831 Chronic kidney disease, stage 3a: Secondary | ICD-10-CM | POA: Diagnosis present

## 2020-07-28 DIAGNOSIS — Z888 Allergy status to other drugs, medicaments and biological substances status: Secondary | ICD-10-CM

## 2020-07-28 DIAGNOSIS — Z7901 Long term (current) use of anticoagulants: Secondary | ICD-10-CM | POA: Diagnosis not present

## 2020-07-28 DIAGNOSIS — D631 Anemia in chronic kidney disease: Secondary | ICD-10-CM | POA: Diagnosis present

## 2020-07-28 DIAGNOSIS — E782 Mixed hyperlipidemia: Secondary | ICD-10-CM | POA: Diagnosis present

## 2020-07-28 DIAGNOSIS — Z6837 Body mass index (BMI) 37.0-37.9, adult: Secondary | ICD-10-CM

## 2020-07-28 DIAGNOSIS — Z841 Family history of disorders of kidney and ureter: Secondary | ICD-10-CM | POA: Diagnosis not present

## 2020-07-28 DIAGNOSIS — Z8249 Family history of ischemic heart disease and other diseases of the circulatory system: Secondary | ICD-10-CM | POA: Diagnosis not present

## 2020-07-28 DIAGNOSIS — Z823 Family history of stroke: Secondary | ICD-10-CM

## 2020-07-28 DIAGNOSIS — G8918 Other acute postprocedural pain: Secondary | ICD-10-CM

## 2020-07-28 DIAGNOSIS — E119 Type 2 diabetes mellitus without complications: Secondary | ICD-10-CM | POA: Diagnosis present

## 2020-07-28 DIAGNOSIS — M160 Bilateral primary osteoarthritis of hip: Secondary | ICD-10-CM | POA: Diagnosis present

## 2020-07-28 DIAGNOSIS — E1122 Type 2 diabetes mellitus with diabetic chronic kidney disease: Secondary | ICD-10-CM | POA: Diagnosis present

## 2020-07-28 DIAGNOSIS — Z419 Encounter for procedure for purposes other than remedying health state, unspecified: Secondary | ICD-10-CM

## 2020-07-28 HISTORY — PX: TOTAL HIP ARTHROPLASTY: SHX124

## 2020-07-28 LAB — GLUCOSE, CAPILLARY
Glucose-Capillary: 143 mg/dL — ABNORMAL HIGH (ref 70–99)
Glucose-Capillary: 128 mg/dL — ABNORMAL HIGH (ref 70–99)
Glucose-Capillary: 193 mg/dL — ABNORMAL HIGH (ref 70–99)
Glucose-Capillary: 291 mg/dL — ABNORMAL HIGH (ref 70–99)

## 2020-07-28 LAB — ABO/RH: ABO/RH(D): O POS

## 2020-07-28 SURGERY — ARTHROPLASTY, HIP, TOTAL, ANTERIOR APPROACH
Anesthesia: Spinal | Site: Hip | Laterality: Right

## 2020-07-28 MED ORDER — LOSARTAN POTASSIUM 50 MG PO TABS
100.0000 mg | ORAL_TABLET | Freq: Every day | ORAL | Status: DC
  Administered 2020-07-30: 100 mg via ORAL
  Filled 2020-07-28 (×2): qty 2

## 2020-07-28 MED ORDER — CEFAZOLIN SODIUM-DEXTROSE 2-4 GM/100ML-% IV SOLN
INTRAVENOUS | Status: AC
  Filled 2020-07-28: qty 100

## 2020-07-28 MED ORDER — BISACODYL 10 MG RE SUPP
10.0000 mg | Freq: Every day | RECTAL | Status: DC | PRN
  Administered 2020-07-30: 10 mg via RECTAL
  Filled 2020-07-28: qty 1

## 2020-07-28 MED ORDER — METHOCARBAMOL 500 MG PO TABS: 500.0000 mg | ORAL_TABLET | Freq: Four times a day (QID) | ORAL | Status: DC | PRN

## 2020-07-28 MED ORDER — CHLORHEXIDINE GLUCONATE 0.12 % MT SOLN: 15.0000 mL | Freq: Once | OROMUCOSAL | Status: AC

## 2020-07-28 MED ORDER — LIDOCAINE HCL (PF) 2 % IJ SOLN
INTRAMUSCULAR | Status: AC
  Filled 2020-07-28: qty 5

## 2020-07-28 MED ORDER — APIXABAN 5 MG PO TABS
5.0000 mg | ORAL_TABLET | Freq: Two times a day (BID) | ORAL | Status: DC
  Administered 2020-07-29 – 2020-07-30 (×3): 5 mg via ORAL
  Filled 2020-07-28 (×3): qty 1

## 2020-07-28 MED ORDER — INSULIN ASPART 100 UNIT/ML ~~LOC~~ SOLN
0.0000 [IU] | Freq: Three times a day (TID) | SUBCUTANEOUS | Status: DC
  Administered 2020-07-28 – 2020-07-29 (×2): 3 [IU] via SUBCUTANEOUS
  Administered 2020-07-29 (×2): 5 [IU] via SUBCUTANEOUS
  Administered 2020-07-30 (×2): 3 [IU] via SUBCUTANEOUS
  Filled 2020-07-28 (×6): qty 1

## 2020-07-28 MED ORDER — NEOMYCIN-POLYMYXIN B GU 40-200000 IR SOLN
Status: DC | PRN
  Administered 2020-07-28: 4 mL

## 2020-07-28 MED ORDER — METHOCARBAMOL 1000 MG/10ML IJ SOLN
500.0000 mg | Freq: Four times a day (QID) | INTRAVENOUS | Status: DC | PRN
  Filled 2020-07-28: qty 5

## 2020-07-28 MED ORDER — BUPIVACAINE-EPINEPHRINE (PF) 0.25% -1:200000 IJ SOLN
INTRAMUSCULAR | Status: AC
  Filled 2020-07-28: qty 30

## 2020-07-28 MED ORDER — PHENOL 1.4 % MT LIQD
1.0000 | OROMUCOSAL | Status: DC | PRN
  Filled 2020-07-28: qty 177

## 2020-07-28 MED ORDER — METOCLOPRAMIDE HCL 10 MG PO TABS: 5.0000 mg | ORAL_TABLET | Freq: Three times a day (TID) | ORAL | Status: DC | PRN

## 2020-07-28 MED ORDER — BUPIVACAINE-EPINEPHRINE 0.25% -1:200000 IJ SOLN
INTRAMUSCULAR | Status: DC | PRN
  Administered 2020-07-28: 30 mL

## 2020-07-28 MED ORDER — DEXAMETHASONE SODIUM PHOSPHATE 4 MG/ML IJ SOLN
INTRAMUSCULAR | Status: DC | PRN
  Administered 2020-07-28: 8 mg via INTRAVENOUS

## 2020-07-28 MED ORDER — ALUM & MAG HYDROXIDE-SIMETH 200-200-20 MG/5ML PO SUSP: 30.0000 mL | ORAL | Status: DC | PRN

## 2020-07-28 MED ORDER — ACETAMINOPHEN 325 MG PO TABS: 325.0000 mg | ORAL_TABLET | Freq: Four times a day (QID) | ORAL | Status: DC | PRN

## 2020-07-28 MED ORDER — FAMOTIDINE 20 MG PO TABS: 20.0000 mg | ORAL_TABLET | Freq: Once | ORAL | Status: AC

## 2020-07-28 MED ORDER — PROPOFOL 500 MG/50ML IV EMUL
INTRAVENOUS | Status: DC | PRN
  Administered 2020-07-28: 75 ug/kg/min via INTRAVENOUS

## 2020-07-28 MED ORDER — FENTANYL CITRATE (PF) 100 MCG/2ML IJ SOLN
INTRAMUSCULAR | Status: DC | PRN
  Administered 2020-07-28 (×2): 50 ug via INTRAVENOUS

## 2020-07-28 MED ORDER — METOPROLOL SUCCINATE ER 25 MG PO TB24
100.0000 mg | ORAL_TABLET | Freq: Every day | ORAL | Status: DC
  Administered 2020-07-29 – 2020-07-30 (×2): 100 mg via ORAL
  Filled 2020-07-28 (×2): qty 4

## 2020-07-28 MED ORDER — FENTANYL CITRATE (PF) 100 MCG/2ML IJ SOLN
INTRAMUSCULAR | Status: AC
  Filled 2020-07-28: qty 2

## 2020-07-28 MED ORDER — SODIUM CHLORIDE FLUSH 0.9 % IV SOLN
INTRAVENOUS | Status: AC
  Filled 2020-07-28: qty 40

## 2020-07-28 MED ORDER — FUROSEMIDE 40 MG PO TABS
40.0000 mg | ORAL_TABLET | Freq: Every day | ORAL | Status: DC
  Administered 2020-07-29 – 2020-07-30 (×2): 40 mg via ORAL
  Filled 2020-07-28 (×2): qty 1

## 2020-07-28 MED ORDER — LACTATED RINGERS IV SOLN: INTRAVENOUS | Status: DC | PRN

## 2020-07-28 MED ORDER — PRAVASTATIN SODIUM 40 MG PO TABS
40.0000 mg | ORAL_TABLET | Freq: Every day | ORAL | Status: DC
  Administered 2020-07-28 – 2020-07-29 (×2): 40 mg via ORAL
  Filled 2020-07-28: qty 2
  Filled 2020-07-28 (×2): qty 1
  Filled 2020-07-28: qty 2
  Filled 2020-07-28: qty 1

## 2020-07-28 MED ORDER — CEFAZOLIN SODIUM-DEXTROSE 2-4 GM/100ML-% IV SOLN
2.0000 g | INTRAVENOUS | Status: AC
  Administered 2020-07-28: 2 g via INTRAVENOUS

## 2020-07-28 MED ORDER — GLIMEPIRIDE 4 MG PO TABS
4.0000 mg | ORAL_TABLET | Freq: Every day | ORAL | Status: DC
  Administered 2020-07-29 – 2020-07-30 (×2): 4 mg via ORAL
  Filled 2020-07-28 (×2): qty 1

## 2020-07-28 MED ORDER — AMLODIPINE BESYLATE 10 MG PO TABS
10.0000 mg | ORAL_TABLET | Freq: Every evening | ORAL | Status: DC
  Administered 2020-07-29: 10 mg via ORAL
  Filled 2020-07-28 (×2): qty 1

## 2020-07-28 MED ORDER — BUPIVACAINE LIPOSOME 1.3 % IJ SUSP
INTRAMUSCULAR | Status: AC
  Filled 2020-07-28: qty 20

## 2020-07-28 MED ORDER — MENTHOL 3 MG MT LOZG
1.0000 | LOZENGE | OROMUCOSAL | Status: DC | PRN
  Filled 2020-07-28: qty 9

## 2020-07-28 MED ORDER — NEOMYCIN-POLYMYXIN B GU 40-200000 IR SOLN
Status: AC
  Filled 2020-07-28: qty 4

## 2020-07-28 MED ORDER — OXYCODONE HCL 5 MG PO TABS
5.0000 mg | ORAL_TABLET | ORAL | Status: DC | PRN
  Administered 2020-07-28: 10 mg via ORAL
  Filled 2020-07-28: qty 2

## 2020-07-28 MED ORDER — ALLOPURINOL 100 MG PO TABS
200.0000 mg | ORAL_TABLET | Freq: Two times a day (BID) | ORAL | Status: DC
  Administered 2020-07-28 – 2020-07-30 (×4): 200 mg via ORAL
  Filled 2020-07-28 (×6): qty 2

## 2020-07-28 MED ORDER — EPHEDRINE SULFATE 50 MG/ML IJ SOLN
INTRAMUSCULAR | Status: DC | PRN
  Administered 2020-07-28: 20 mg via INTRAVENOUS
  Administered 2020-07-28 (×2): 10 mg via INTRAVENOUS

## 2020-07-28 MED ORDER — SODIUM CHLORIDE 0.9 % IV SOLN
INTRAVENOUS | Status: DC | PRN
  Administered 2020-07-28: 60 mL

## 2020-07-28 MED ORDER — MAGNESIUM CITRATE PO SOLN
1.0000 | Freq: Once | ORAL | Status: DC | PRN
  Filled 2020-07-28: qty 296

## 2020-07-28 MED ORDER — DEXAMETHASONE SODIUM PHOSPHATE 10 MG/ML IJ SOLN
INTRAMUSCULAR | Status: AC
  Filled 2020-07-28: qty 1

## 2020-07-28 MED ORDER — ONDANSETRON HCL 4 MG/2ML IJ SOLN: 4.0000 mg | Freq: Once | INTRAMUSCULAR | Status: DC | PRN

## 2020-07-28 MED ORDER — FENTANYL CITRATE (PF) 100 MCG/2ML IJ SOLN: 25.0000 ug | INTRAMUSCULAR | Status: DC | PRN

## 2020-07-28 MED ORDER — TRAMADOL HCL 50 MG PO TABS
50.0000 mg | ORAL_TABLET | Freq: Four times a day (QID) | ORAL | Status: DC
  Administered 2020-07-28 – 2020-07-30 (×8): 50 mg via ORAL
  Filled 2020-07-28 (×8): qty 1

## 2020-07-28 MED ORDER — FAMOTIDINE 20 MG PO TABS
ORAL_TABLET | ORAL | Status: AC
  Administered 2020-07-28: 20 mg via ORAL
  Filled 2020-07-28: qty 1

## 2020-07-28 MED ORDER — CHLORHEXIDINE GLUCONATE CLOTH 2 % EX PADS
6.0000 | MEDICATED_PAD | Freq: Every day | CUTANEOUS | Status: DC
  Administered 2020-07-28: 6 via TOPICAL

## 2020-07-28 MED ORDER — OXYCODONE HCL 5 MG PO TABS
10.0000 mg | ORAL_TABLET | ORAL | Status: DC | PRN
  Filled 2020-07-28: qty 3

## 2020-07-28 MED ORDER — ONDANSETRON HCL 4 MG PO TABS: 4.0000 mg | ORAL_TABLET | Freq: Four times a day (QID) | ORAL | Status: DC | PRN

## 2020-07-28 MED ORDER — DOCUSATE SODIUM 100 MG PO CAPS
100.0000 mg | ORAL_CAPSULE | Freq: Two times a day (BID) | ORAL | Status: DC
  Administered 2020-07-28 – 2020-07-30 (×4): 100 mg via ORAL
  Filled 2020-07-28 (×4): qty 1

## 2020-07-28 MED ORDER — SODIUM CHLORIDE 0.9 % IV SOLN: INTRAVENOUS | Status: DC

## 2020-07-28 MED ORDER — ACETAMINOPHEN 500 MG PO TABS
1000.0000 mg | ORAL_TABLET | Freq: Four times a day (QID) | ORAL | Status: AC
  Administered 2020-07-28 – 2020-07-29 (×3): 1000 mg via ORAL
  Filled 2020-07-28 (×3): qty 2

## 2020-07-28 MED ORDER — MAGNESIUM HYDROXIDE 400 MG/5ML PO SUSP: 30.0000 mL | Freq: Every day | ORAL | Status: DC | PRN

## 2020-07-28 MED ORDER — BUPIVACAINE HCL (PF) 0.5 % IJ SOLN
INTRAMUSCULAR | Status: DC | PRN
  Administered 2020-07-28: 2.7 mL

## 2020-07-28 MED ORDER — ORAL CARE MOUTH RINSE: 15.0000 mL | Freq: Once | OROMUCOSAL | Status: AC

## 2020-07-28 MED ORDER — ONDANSETRON HCL 4 MG/2ML IJ SOLN: 4.0000 mg | Freq: Four times a day (QID) | INTRAMUSCULAR | Status: DC | PRN

## 2020-07-28 MED ORDER — METOCLOPRAMIDE HCL 5 MG/ML IJ SOLN: 5.0000 mg | Freq: Three times a day (TID) | INTRAMUSCULAR | Status: DC | PRN

## 2020-07-28 MED ORDER — HYDROMORPHONE HCL 1 MG/ML IJ SOLN: 0.5000 mg | INTRAMUSCULAR | Status: DC | PRN

## 2020-07-28 MED ORDER — DIPHENHYDRAMINE HCL 12.5 MG/5ML PO ELIX
12.5000 mg | ORAL_SOLUTION | ORAL | Status: DC | PRN
  Filled 2020-07-28: qty 10

## 2020-07-28 MED ORDER — DULAGLUTIDE 1.5 MG/0.5ML ~~LOC~~ SOAJ: 1.5000 mg | SUBCUTANEOUS | Status: DC

## 2020-07-28 MED ORDER — BUPIVACAINE HCL (PF) 0.5 % IJ SOLN
INTRAMUSCULAR | Status: AC
  Filled 2020-07-28: qty 10

## 2020-07-28 MED ORDER — ZOLPIDEM TARTRATE 5 MG PO TABS: 5.0000 mg | ORAL_TABLET | Freq: Every evening | ORAL | Status: DC | PRN

## 2020-07-28 MED ORDER — PROPOFOL 500 MG/50ML IV EMUL
INTRAVENOUS | Status: AC
  Filled 2020-07-28: qty 50

## 2020-07-28 MED ORDER — FENOFIBRATE 160 MG PO TABS
160.0000 mg | ORAL_TABLET | Freq: Every day | ORAL | Status: DC
  Administered 2020-07-29 – 2020-07-30 (×2): 160 mg via ORAL
  Filled 2020-07-28 (×2): qty 1

## 2020-07-28 MED ORDER — CHLORHEXIDINE GLUCONATE 0.12 % MT SOLN
OROMUCOSAL | Status: AC
  Administered 2020-07-28: 15 mL via OROMUCOSAL
  Filled 2020-07-28: qty 15

## 2020-07-28 SURGICAL SUPPLY — 59 items
BLADE SAGITTAL AGGR TOOTH XLG (BLADE) ×3 IMPLANT
BNDG COHESIVE 6X5 TAN STRL LF (GAUZE/BANDAGES/DRESSINGS) ×9 IMPLANT
CANISTER SUCT 1200ML W/VALVE (MISCELLANEOUS) ×3 IMPLANT
CANISTER WOUND CARE 500ML ATS (WOUND CARE) ×3 IMPLANT
CHLORAPREP W/TINT 26 (MISCELLANEOUS) ×3 IMPLANT
COVER BACK TABLE REUSABLE LG (DRAPES) ×3 IMPLANT
COVER WAND RF STERILE (DRAPES) ×3 IMPLANT
DRAPE 3/4 80X56 (DRAPES) ×9 IMPLANT
DRAPE C-ARM XRAY 36X54 (DRAPES) ×3 IMPLANT
DRAPE INCISE IOBAN 66X60 STRL (DRAPES) IMPLANT
DRAPE POUCH INSTRU U-SHP 10X18 (DRAPES) ×3 IMPLANT
DRESSING SURGICEL FIBRLLR 1X2 (HEMOSTASIS) ×2 IMPLANT
DRSG OPSITE POSTOP 4X8 (GAUZE/BANDAGES/DRESSINGS) ×6 IMPLANT
DRSG SURGICEL FIBRILLAR 1X2 (HEMOSTASIS) ×6
ELECT BLADE 6.5 EXT (BLADE) ×3 IMPLANT
ELECT REM PT RETURN 9FT ADLT (ELECTROSURGICAL) ×3
ELECTRODE REM PT RTRN 9FT ADLT (ELECTROSURGICAL) ×1 IMPLANT
GLOVE BIOGEL PI IND STRL 9 (GLOVE) ×1 IMPLANT
GLOVE BIOGEL PI INDICATOR 9 (GLOVE) ×2
GLOVE SURG SYN 9.0  PF PI (GLOVE) ×4
GLOVE SURG SYN 9.0 PF PI (GLOVE) ×2 IMPLANT
GOWN SRG 2XL LVL 4 RGLN SLV (GOWNS) ×1 IMPLANT
GOWN STRL NON-REIN 2XL LVL4 (GOWNS) ×2
GOWN STRL REUS W/ TWL LRG LVL3 (GOWN DISPOSABLE) ×1 IMPLANT
GOWN STRL REUS W/TWL LRG LVL3 (GOWN DISPOSABLE) ×2
HEMOVAC 400CC 10FR (MISCELLANEOUS) IMPLANT
HIP DBL LINER 54X28 (Liner) ×3 IMPLANT
HIP FEM HD S 28 (Head) ×3 IMPLANT
HOLDER FOLEY CATH W/STRAP (MISCELLANEOUS) ×3 IMPLANT
HOOD PEEL AWAY FLYTE STAYCOOL (MISCELLANEOUS) ×3 IMPLANT
KIT PREVENA INCISION MGT 13 (CANNISTER) ×3 IMPLANT
MAT ABSORB  FLUID 56X50 GRAY (MISCELLANEOUS) ×2
MAT ABSORB FLUID 56X50 GRAY (MISCELLANEOUS) ×1 IMPLANT
NDL SAFETY ECLIPSE 18X1.5 (NEEDLE) ×1 IMPLANT
NEEDLE HYPO 18GX1.5 SHARP (NEEDLE) ×2
NEEDLE SPNL 20GX3.5 QUINCKE YW (NEEDLE) ×6 IMPLANT
NS IRRIG 1000ML POUR BTL (IV SOLUTION) ×3 IMPLANT
PACK HIP COMPR (MISCELLANEOUS) ×3 IMPLANT
SCALPEL PROTECTED #10 DISP (BLADE) ×6 IMPLANT
SHELL ACETABULAR SZ 54 DM (Shell) ×3 IMPLANT
SOL PREP PVP 2OZ (MISCELLANEOUS) ×3
SOLUTION PREP PVP 2OZ (MISCELLANEOUS) ×1 IMPLANT
SPONGE DRAIN TRACH 4X4 STRL 2S (GAUZE/BANDAGES/DRESSINGS) ×3 IMPLANT
STAPLER SKIN PROX 35W (STAPLE) ×3 IMPLANT
STEM FEMORAL SZ2 STD COLLARED (Stem) ×3 IMPLANT
STRAP SAFETY 5IN WIDE (MISCELLANEOUS) ×3 IMPLANT
SUT DVC 2 QUILL PDO  T11 36X36 (SUTURE) ×2
SUT DVC 2 QUILL PDO T11 36X36 (SUTURE) ×1 IMPLANT
SUT SILK 0 (SUTURE) ×2
SUT SILK 0 30XBRD TIE 6 (SUTURE) ×1 IMPLANT
SUT V-LOC 90 ABS DVC 3-0 CL (SUTURE) ×3 IMPLANT
SUT VIC AB 1 CT1 36 (SUTURE) ×3 IMPLANT
SYR 20ML LL LF (SYRINGE) ×3 IMPLANT
SYR 30ML LL (SYRINGE) ×3 IMPLANT
SYR 50ML LL SCALE MARK (SYRINGE) ×6 IMPLANT
SYR BULB IRRIG 60ML STRL (SYRINGE) ×3 IMPLANT
TAPE MICROFOAM 4IN (TAPE) ×3 IMPLANT
TOWEL OR 17X26 4PK STRL BLUE (TOWEL DISPOSABLE) ×3 IMPLANT
TRAY FOLEY MTR SLVR 16FR STAT (SET/KITS/TRAYS/PACK) ×3 IMPLANT

## 2020-07-28 NOTE — Evaluation (Signed)
Physical Therapy Evaluation Patient Details Name: Crystal Terrell MRN: [id number] DOB: July 25, 1946 Today's Date: 07/28/2020   History of Present Illness  Pt is a 74 yo female diagnosed with OA of the R hip and is s/p R THA.  PMH includes: DM, HTN, anemia, gout, DVT, PE, IVC filter placement 07/21/20, and adenoma of the rectum.    Clinical Impression  Pt was pleasant and motivated to participate during the session. Pt reported 2/10 pain at beginning of session. Pt able to perform supine/sitting exercises w/ instructional cueing and mod therapist assist for SLR and hip abd. Pt demonstrated mod-I w/ bed mobility and req extra time and effort. Pt was able to perform sit to stand to RW w/ CGA; demonstrated good eccentric control throughout and no need for cueing. Pt demonstrated good standing balance and able to sufficiently shift weight through BLE with min BUE support on RW. Pt was able to ambulate 59ft to recliner with a step-through pattern and slow, short, bilateral step lengths. Pt is expected to make good progress towards goals while in acute care and will benefit from HHPT services upon discharge to safely address deficits listed in patient problem list for decreased caregiver assistance and eventual return to PLOF.      Follow Up Recommendations Home health PT;Supervision for mobility/OOB    Equipment Recommendations  Rolling walker with 5" wheels    Recommendations for Other Services       Precautions / Restrictions Precautions Precautions: Fall;Anterior Hip Precaution Booklet Issued: Yes (comment) Restrictions Weight Bearing Restrictions: Yes RLE Weight Bearing: Weight bearing as tolerated      Mobility  Bed Mobility Overal bed mobility: Modified Independent             General bed mobility comments: Extra time and effort  Transfers Overall transfer level: Needs assistance Equipment used: Rolling walker (2 wheeled) Transfers: Sit to/from Stand Sit to Stand: Min  guard         General transfer comment: Good eccentric and concentric control and stability  Ambulation/Gait Ambulation/Gait assistance: Min guard Gait Distance (Feet): 4 Feet Assistive device: Rolling walker (2 wheeled) Gait Pattern/deviations: Step-through pattern;Decreased step length - left;Decreased step length - right Gait velocity: decreased   General Gait Details: Slow cadence with short B step length at with limited amb at EOB and then from bed to chair but steady without LOB  Stairs            Wheelchair Mobility    Modified Rankin (Stroke Patients Only)       Balance Overall balance assessment: Needs assistance Sitting-balance support: Feet supported Sitting balance-Leahy Scale: Good     Standing balance support: Bilateral upper extremity supported Standing balance-Leahy Scale: Good                               Pertinent Vitals/Pain Pain Assessment: 0-10 Pain Score: 2  Pain Location: R hip Pain Descriptors / Indicators: Sore Pain Intervention(s): Premedicated before session;Monitored during session    Home Living Family/patient expects to be discharged to:: Private residence Living Arrangements: Spouse/significant other Available Help at Discharge: Family;Available 24 hours/day Type of Home: House Home Access: Stairs to enter Entrance Stairs-Rails: None Entrance Stairs-Number of Steps: 2 Home Layout: One level Home Equipment: Shower seat - built in Additional Comments: Pt reports bathroom likely too small for a RW to fit in    Prior Function Level of Independence: Independent  Comments: Ind amb without an AD limited community distances, leaned on a shopping cart for grocery store distances, no fall history, Ind with ADLs     Hand Dominance   Dominant Hand: Right    Extremity/Trunk Assessment        Lower Extremity Assessment Lower Extremity Assessment: Generalized weakness;RLE deficits/detail RLE Deficits /  Details: BLE ankle strength and AROM WNL; BLE sensation to light touch grossly intact RLE: Unable to fully assess due to pain RLE Sensation: WNL       Communication   Communication: No difficulties  Cognition Arousal/Alertness: Awake/alert Behavior During Therapy: WFL for tasks assessed/performed Overall Cognitive Status: Within Functional Limits for tasks assessed                                        General Comments      Exercises Total Joint Exercises Ankle Circles/Pumps: AROM;Strengthening;Both;5 reps;10 reps Quad Sets: Strengthening;Both;10 reps Gluteal Sets: Strengthening;Both;10 reps Hip ABduction/ADduction: AAROM;Right;10 reps Straight Leg Raises: AAROM;Right;10 reps Long Arc Quad: AROM;Strengthening;Both;10 reps Knee Flexion: AROM;Strengthening;Both;10 reps Marching in Standing: AROM;Strengthening;Both;5 reps;Standing Other Exercises Other Exercises: HEP education for BLE APs, QS, and GS x 10 each every 1-2 hours   Assessment/Plan    PT Assessment Patient needs continued PT services  PT Problem List Decreased strength;Decreased activity tolerance;Decreased balance;Decreased mobility;Decreased knowledge of use of DME;Pain       PT Treatment Interventions DME instruction;Gait training;Stair training;Functional mobility training;Therapeutic activities;Therapeutic exercise;Balance training;Patient/family education    PT Goals (Current goals can be found in the Care Plan section)  Acute Rehab PT Goals Patient Stated Goal: To get stronger PT Goal Formulation: With patient Time For Goal Achievement: 08/10/20 Potential to Achieve Goals: Good    Frequency BID   Barriers to discharge        Co-evaluation               AM-PAC PT "6 Clicks" Mobility  Outcome Measure Help needed turning from your back to your side while in a flat bed without using bedrails?: A Little Help needed moving from lying on your back to sitting on the side of a  flat bed without using bedrails?: A Little Help needed moving to and from a bed to a chair (including a wheelchair)?: A Little Help needed standing up from a chair using your arms (e.g., wheelchair or bedside chair)?: A Little Help needed to walk in hospital room?: A Lot Help needed climbing 3-5 steps with a railing? : A Lot 6 Click Score: 16    End of Session Equipment Utilized During Treatment: Gait belt Activity Tolerance: Patient tolerated treatment well Patient left: in chair;with call bell/phone within reach;with chair alarm set;with SCD's reapplied Nurse Communication: Mobility status PT Visit Diagnosis: Other abnormalities of gait and mobility (R26.89);Muscle weakness (generalized) (M62.81);Pain Pain - Right/Left: Right Pain - part of body: Hip    Time: 6283-6629 PT Time Calculation (min) (ACUTE ONLY): 39 min   Charges:   PT Evaluation $PT Eval Moderate Complexity: 1 Mod PT Treatments $Therapeutic Exercise: 8-22 mins        D. Royetta Asal PT, DPT 07/28/20, 5:32 PM

## 2020-07-28 NOTE — Anesthesia Procedure Notes (Signed)
Spinal  Patient location during procedure: OR Start time: 07/28/2020 11:06 AM End time: 07/28/2020 11:15 AM Staffing Performed: resident/CRNA and anesthesiologist  Anesthesiologist: Emmie Niemann, MD Resident/CRNA: Gaynelle Cage, CRNA Preanesthetic Checklist Completed: patient identified, IV checked, site marked, risks and benefits discussed, surgical consent, monitors and equipment checked, pre-op evaluation and timeout performed Spinal Block Patient position: sitting Prep: Betadine Patient monitoring: heart rate, continuous pulse ox and blood pressure Approach: midline Location: L3-4 Injection technique: single-shot Needle Needle type: Introducer and Pencil-Tip  Needle gauge: 24 G Needle length: 9 cm Assessment Sensory level: T4

## 2020-07-28 NOTE — Anesthesia Preprocedure Evaluation (Signed)
Anesthesia Evaluation  Patient identified by MRN, date of birth, ID band Patient awake    Reviewed: Allergy & Precautions, NPO status , Patient's Chart, lab work & pertinent test results  History of Anesthesia Complications Negative for: history of anesthetic complications  Airway Mallampati: III  TM Distance: >3 FB Neck ROM: Full    Dental  (+) Poor Dentition, Missing   Pulmonary neg pulmonary ROS, neg sleep apnea, neg COPD,    breath sounds clear to auscultation- rhonchi (-) wheezing      Cardiovascular hypertension, Pt. on medications + DVT (hx of DVT/PE, IVC filter placed)  (-) CAD, (-) Past MI, (-) Cardiac Stents and (-) CABG  Rhythm:Regular Rate:Normal - Systolic murmurs and - Diastolic murmurs    Neuro/Psych neg Seizures negative neurological ROS  negative psych ROS   GI/Hepatic   Endo/Other  diabetes, Oral Hypoglycemic Agents  Renal/GU      Musculoskeletal  (+) Arthritis ,   Abdominal (+) + obese,   Peds  Hematology  (+) anemia ,   Anesthesia Other Findings Past Medical History: No date: Adenoma of rectum No date: Anemia No date: Diabetes mellitus without complication (HCC) No date: Diverticulosis No date: Ganglion cyst     Comment:  right No date: Gout No date: Hypertension No date: Mixed hyperlipidemia No date: Morbid obesity with BMI of 40.0-44.9, adult ([place]) No date: Venous insufficiency of lower extremity   Reproductive/Obstetrics                             Lab Results  Component Value Date   WBC 4.4 07/22/2020   HGB 11.5 (L) 07/22/2020   HCT 36.3 07/22/2020   MCV 86.8 07/22/2020   PLT 238 07/22/2020    Anesthesia Physical Anesthesia Plan  ASA: III  Anesthesia Plan: Spinal   Post-op Pain Management:    Induction:   PONV Risk Score and Plan: 2 and Propofol infusion  Airway Management Planned: Natural Airway  Additional Equipment:   Intra-op Plan:    Post-operative Plan:   Informed Consent: I have reviewed the patients History and Physical, chart, labs and discussed the procedure including the risks, benefits and alternatives for the proposed anesthesia with the patient or authorized representative who has indicated his/her understanding and acceptance.     Dental advisory given  Plan Discussed with: CRNA and Anesthesiologist  Anesthesia Plan Comments: (Discussed possible GA if unable to perform spinal injection. Last dose of eliquis >1 week ago, has IVC filter in place for hx of DVT/PE)        Anesthesia Quick Evaluation

## 2020-07-28 NOTE — Op Note (Signed)
07/28/2020  12:54 PM  PATIENT:  Crystal Terrell  74 y.o. female  PRE-OPERATIVE DIAGNOSIS:  Primary localized osteoarthritis of right hip M16.11  POST-OPERATIVE DIAGNOSIS:  Primary localized osteoarthritis of right hip M16.11  PROCEDURE:  Procedure(s): TOTAL HIP ARTHROPLASTY ANTERIOR APPROACH (Right)  SURGEON: Laurene Footman, MD  ASSISTANTS: None  ANESTHESIA:   spinal  EBL:  Total I/O In: 1000 [I.V.:1000] Out: 700 [Urine:500; Blood:200]  BLOOD ADMINISTERED:none  DRAINS: Incisional wound VAC   LOCAL MEDICATIONS USED:  MARCAINE    and OTHER Exparel  SPECIMEN:  Source of Specimen:  Right femoral head  DISPOSITION OF SPECIMEN:  PATHOLOGY  COUNTS:  YES  TOURNIQUET:  * No tourniquets in log *  IMPLANTS: Medacta AMIS 1 standard stem with 54 mm Mpact DM cup and liner with metal S 28 mm head  DICTATION: .Dragon Dictation   The patient was brought to the operating room and after spinal anesthesia was obtained patient was placed on the operative table with the ipsilateral foot into the Medacta attachment, contralateral leg on a well-padded table. C-arm was brought in and preop template x-ray taken. After prepping and draping in usual sterile fashion appropriate patient identification and timeout procedures were completed. Anterior approach to the hip was obtained and centered over the greater trochanter and TFL muscle. The subcutaneous tissue was incised hemostasis being achieved by electrocautery. TFL fascia was incised and the muscle retracted laterally deep retractor placed. The lateral femoral circumflex vessels were identified and ligated. The anterior capsule was exposed and a capsulotomy performed. The neck was identified and a femoral neck cut carried out with a saw. The head was removed without difficulty and showed sclerotic femoral head and acetabulum. Reaming was carried out to 52 mm and a 54 mm cup trial gave appropriate tightness to the acetabular component a 54 DM cup was  impacted into position. The leg was then externally rotated and ischiofemoral and pubofemoral releases carried out. The femur was sequentially broached to a size 1, size 1 standard with S head trials were placed and the final components chosen. The 1 standard stem was inserted along with a metal S 28 mm head and 54 mm liner. The hip was reduced and was stable the wound was thoroughly irrigated with fibrillar placed along the posterior capsule and medial neck. The deep fascia ws closed using a heavy Quill after infiltration of 30 cc of quarter percent Sensorcaine with epinephrine mixed with Exparel .3-0 V-loc to close the skin with skin staples.  Incisional wound VAC applied and patient was sent to recovery in stable condition.   PLAN OF CARE: Admit for overnight observation

## 2020-07-28 NOTE — Transfer of Care (Signed)
Immediate Anesthesia Transfer of Care Note  Patient: Ames Dura  Procedure(s) Performed: TOTAL HIP ARTHROPLASTY ANTERIOR APPROACH (Right Hip)  Patient Location: PACU  Anesthesia Type:Regional  Level of Consciousness: awake  Airway & Oxygen Therapy: Patient Spontanous Breathing and Patient connected to nasal cannula oxygen  Post-op Assessment: Report given to RN and Post -op Vital signs reviewed and stable  Post vital signs: Reviewed and stable  Last Vitals:  Vitals Value Taken Time  BP 100/48 07/28/20 1253  Temp    Pulse 65 07/28/20 1255  Resp    SpO2 100 % 07/28/20 1255  Vitals shown include unvalidated device data.  Last Pain:  Vitals:   07/28/20 0812  TempSrc: Tympanic  PainSc: 0-No pain         Complications: No complications documented.

## 2020-07-29 LAB — CBC
HCT: 28.7 % — ABNORMAL LOW (ref 36.0–46.0)
Hemoglobin: 9.7 g/dL — ABNORMAL LOW (ref 12.0–15.0)
MCH: 28.1 pg (ref 26.0–34.0)
MCHC: 33.8 g/dL (ref 30.0–36.0)
MCV: 83.2 fL (ref 80.0–100.0)
Platelets: 208 10*3/uL (ref 150–400)
RBC: 3.45 MIL/uL — ABNORMAL LOW (ref 3.87–5.11)
RDW: 13.1 % (ref 11.5–15.5)
WBC: 7.5 10*3/uL (ref 4.0–10.5)
nRBC: 0 % (ref 0.0–0.2)

## 2020-07-29 LAB — GLUCOSE, CAPILLARY
Glucose-Capillary: 248 mg/dL — ABNORMAL HIGH (ref 70–99)
Glucose-Capillary: 248 mg/dL — ABNORMAL HIGH (ref 70–99)
Glucose-Capillary: 188 mg/dL — ABNORMAL HIGH (ref 70–99)
Glucose-Capillary: 202 mg/dL — ABNORMAL HIGH (ref 70–99)

## 2020-07-29 LAB — BASIC METABOLIC PANEL
Anion gap: 8 (ref 5–15)
BUN: 28 mg/dL — ABNORMAL HIGH (ref 8–23)
CO2: 23 mmol/L (ref 22–32)
Calcium: 8.6 mg/dL — ABNORMAL LOW (ref 8.9–10.3)
Chloride: 107 mmol/L (ref 98–111)
Creatinine, Ser: 1.61 mg/dL — ABNORMAL HIGH (ref 0.44–1.00)
GFR calc Af Amer: 36 mL/min — ABNORMAL LOW (ref >60)
GFR calc non Af Amer: 31 mL/min — ABNORMAL LOW (ref >60)
Glucose, Bld: 265 mg/dL — ABNORMAL HIGH (ref 70–99)
Potassium: 4 mmol/L (ref 3.5–5.1)
Sodium: 138 mmol/L (ref 135–145)

## 2020-07-29 MED ORDER — MAGNESIUM HYDROXIDE 400 MG/5ML PO SUSP
30.0000 mL | Freq: Once | ORAL | Status: AC
  Administered 2020-07-29: 30 mL via ORAL
  Filled 2020-07-29: qty 30

## 2020-07-29 MED ORDER — FE FUMARATE-B12-VIT C-FA-IFC PO CAPS
1.0000 | ORAL_CAPSULE | Freq: Two times a day (BID) | ORAL | Status: DC
  Administered 2020-07-29 – 2020-07-30 (×3): 1 via ORAL
  Filled 2020-07-29 (×4): qty 1

## 2020-07-29 NOTE — TOC Initial Note (Signed)
Transition of Care [facility]) - Initial/Assessment Note    Patient Details  Name: Crystal Terrell MRN: [id number] Date of Birth: 22-Oct-1946  [facility]) CM/SW Contact:    Shelbie Ammons, RN Phone Number: 07/29/2020, 9:08 AM  Clinical Narrative:    RNCM met with patient at bedside. Patient sitting up in bed and reports to feeling well this morning. Patient reports that she has plenty of assistance at home and her daughter will pick her up when she is ready to leave. RNCM discussed home health at equipment recommendations. Patient is already set up with [facility] for PT which she is happy with and does want the rolling walker. RNCM reached out to [facility] with [employer] for rolling walker. RNCM will follow for needs.                Expected Discharge Plan: [place] Barriers to Discharge: No Barriers Identified   Patient Goals and CMS Choice        Expected Discharge Plan and Services Expected Discharge Plan: [place]   Discharge Planning Services: CM Consult Post Acute Care Choice: Durable Medical Equipment, Home Health Living arrangements for the past 2 months: Single Family Home                 DME Arranged: Walker rolling DME Agency: [employer] Date DME Agency Contacted: 07/29/20 Time DME Agency Contacted: [phone] Representative spoke with at DME Agency: Zack [place] Arranged: PT [place]: [facility] (formerly [employer]) Date [place]: 07/29/20 Time [place]: [phone] Representative spoke with at [place] Arrangements/Services Living arrangements for the past 2 months: [place] with:: Adult Children Patient language and need for interpreter reviewed:: Yes Do you feel safe going back to the place where you live?: Yes      Need for Family Participation in Patient Care: Yes (Comment) Care giver support system in place?: Yes (comment)   Criminal Activity/Legal Involvement  Pertinent to Current Situation/Hospitalization: No - Comment as needed  Activities of Daily Living Home Assistive Devices/Equipment: Dentures (specify type) ADL Screening (condition at time of admission) Patient's cognitive ability adequate to safely complete daily activities?: Yes Is the patient deaf or have difficulty hearing?: No Does the patient have difficulty seeing, even when wearing glasses/contacts?: No Does the patient have difficulty concentrating, remembering, or making decisions?: No Patient able to express need for assistance with ADLs?: Yes Does the patient have difficulty dressing or bathing?: No Independently performs ADLs?: Yes (appropriate for developmental age) Does the patient have difficulty walking or climbing stairs?: No Weakness of Legs: Right Weakness of Arms/Hands: None  Permission Sought/Granted                  Emotional Assessment Appearance:: Appears stated age Attitude/Demeanor/Rapport: Engaged Affect (typically observed): Appropriate, Calm Orientation: : Oriented to Self, Oriented to Place, Oriented to  Time, Oriented to Situation Alcohol / Substance Use: Not Applicable Psych Involvement: No (comment)  Admission diagnosis:  S/P hip replacement [Z96.649] Patient Active Problem List   Diagnosis Date Noted  . S/P hip replacement 07/28/2020  . Morbid obesity due to excess calories ([place]) 07/20/2020  . Mixed hyperlipidemia 07/20/2020  . Chronic right hip pain 06/24/2020  . Anemia of chronic disease 05/11/2020  . Essential hypertension   . Hypoxia   . Acute deep vein thrombosis (DVT) of left peroneal vein (HCC)   . Type 2 diabetes mellitus without  complication, without long-term current use of insulin ([place])   . Pulmonary embolism ([place]) 12/18/2019  . CKD (chronic kidney disease) stage 3, GFR 30-59 ml/min 03/06/2019  . Systolic murmur 37/94/3276  . Encounter for general adult medical examination without abnormal findings 09/16/2016  . Vaccine  counseling 01/14/2016  . Hx of adenomatous colonic polyps 08/01/2014  . History of gout 06/30/2014   PCP:  Dion Body, MD Pharmacy:   [facility] [place] [address] [place]) [place] [address] Phone: [phone] Fax: [phone]     Social Determinants of Health (SDOH) Interventions    Readmission Risk Interventions No flowsheet data found.

## 2020-07-29 NOTE — Anesthesia Postprocedure Evaluation (Signed)
Anesthesia Post Note  Patient: Crystal Terrell  Procedure(s) Performed: TOTAL HIP ARTHROPLASTY ANTERIOR APPROACH (Right Hip)  Patient location during evaluation: Nursing Unit Anesthesia Type: Spinal Level of consciousness: oriented and awake and alert Pain management: pain level controlled Vital Signs Assessment: post-procedure vital signs reviewed and stable Respiratory status: spontaneous breathing and respiratory function stable Cardiovascular status: blood pressure returned to baseline and stable Postop Assessment: no headache, no backache, no apparent nausea or vomiting and patient able to bend at knees Anesthetic complications: no   No complications documented.   Last Vitals:  Vitals:   07/28/20 2331 07/29/20 0357  BP: (!) 146/61 136/64  Pulse: 91 81  Resp: 16 16  Temp: 37.3 C 37.2 C  SpO2: 98% 98%    Last Pain:  Vitals:   07/29/20 0357  TempSrc: Oral  PainSc:                  Lia Foyer

## 2020-07-29 NOTE — Progress Notes (Signed)
   Subjective: 1 Day Post-Op Procedure(s) (LRB): TOTAL HIP ARTHROPLASTY ANTERIOR APPROACH (Right) Patient reports pain as mild.   Patient is well, and has had no acute complaints or problems Denies any CP, SOB, ABD pain. We will continue therapy today.  Plan is to go Home after hospital stay.  Objective: Vital signs in last 24 hours: Temp:  [97 F (36.1 C)-99.2 F (37.3 C)] 98.1 F (36.7 C) (08/11 0749) Pulse Rate:  [57-91] 69 (08/11 0749) Resp:  [9-17] 17 (08/11 0749) BP: (100-146)/(48-89) 130/62 (08/11 0749) SpO2:  [97 %-100 %] 99 % (08/11 0749) Weight:  [97 kg] 97 kg (08/10 0812)  Intake/Output from previous day: 08/10 0701 - 08/11 0700 In: 2931.9 [P.O.:240; I.V.:2691.9] Out: 2150 [Urine:1950; Blood:200] Intake/Output this shift: No intake/output data recorded.  Recent Labs    07/29/20 0406  HGB 9.7*   Recent Labs    07/29/20 0406  WBC 7.5  RBC 3.45*  HCT 28.7*  PLT 208   Recent Labs    07/29/20 0406  NA 138  K 4.0  CL 107  CO2 23  BUN 28*  CREATININE 1.61*  GLUCOSE 265*  CALCIUM 8.6*   No results for input(s): LABPT, INR in the last 72 hours.  EXAM General - Patient is Alert, Appropriate and Oriented Extremity - Neurovascular intact Sensation intact distally Intact pulses distally Dorsiflexion/Plantar flexion intact No cellulitis present Compartment soft Dressing - dressing C/D/I and no drainage, prevena intact with out drainage Motor Function - intact, moving foot and toes well on exam.   Past Medical History:  Diagnosis Date  . Adenoma of rectum   . Anemia   . Diabetes mellitus without complication ([place])   . Diverticulosis   . Ganglion cyst    right  . Gout   . Hypertension   . Mixed hyperlipidemia   . Morbid obesity with BMI of 40.0-44.9, adult ([place])   . Venous insufficiency of lower extremity     Assessment/Plan:   1 Day Post-Op Procedure(s) (LRB): TOTAL HIP ARTHROPLASTY ANTERIOR APPROACH (Right) Active Problems:   S/P hip  replacement  Estimated body mass index is 39.11 kg/m as calculated from the following:   Height as of this encounter: 5\' 2"  (1.575 m).   Weight as of this encounter: 97 kg. Advance diet Up with therapy  Needs BM Labs and vital signs stable Hgb 9.7 - Start Iron supplement, Recheck labs in the am CM to assist with discharge  DVT Prophylaxis - TED hose and SCDs, Eliquis Weight-Bearing as tolerated to right leg   T. Rachelle Hora, PA-C [place] 07/29/2020, 8:03 AM

## 2020-07-29 NOTE — Evaluation (Signed)
Occupational Therapy Evaluation Patient Details Name: Crystal Terrell MRN: [id number] DOB: 1946/08/18 Today's Date: 07/29/2020    History of Present Illness Pt is a 74 yo female diagnosed with OA of the R hip and is s/p R THA.  PMH includes: DM, HTN, anemia, gout, DVT, PE, IVC filter placement 07/21/20, and adenoma of the rectum.   Clinical Impression   Pt seen for OT evaluation this date, POD#1 from above surgery. Pt was independent in all ADL prior to surgery including mobility. Pt is eager to return to PLOF with less pain and improved safety and independence. Pt currently requires MIN A for LB dressing and bathing while in seated position due to pain and limited AROM of R hip. Pt instructed in self care skills, falls prevention strategies, home/routines modifications, DME/AE for LB bathing and dressing tasks, and compression stocking mgt strategies. Handout provided to support recall and carryover. Pt verbalized understanding and would benefit from additional instruction in self care skills and techniques to help maintain precautions with or without assistive devices to support recall and carryover prior to discharge. Do not anticipate additional skilled OT needs upon discharge.      Follow Up Recommendations  No OT follow up    Equipment Recommendations  3 in 1 bedside commode    Recommendations for Other Services       Precautions / Restrictions Precautions Precautions: Fall;Anterior Hip Precaution Booklet Issued: Yes (comment) Restrictions Weight Bearing Restrictions: Yes RLE Weight Bearing: Weight bearing as tolerated      Mobility Bed Mobility Overal bed mobility: Modified Independent             General bed mobility comments: deferred, up in recliner  Transfers Overall transfer level: Needs assistance Equipment used: Rolling walker (2 wheeled) Transfers: Sit to/from Stand Sit to Stand: Min guard         General transfer comment: From low bed height, pt took a  few attempts before able to come to standing w/ min cueing for hand placement. Once moving, pt demonstrated good eccentric and concentric control and stability    Balance Overall balance assessment: Needs assistance Sitting-balance support: Feet supported Sitting balance-Leahy Scale: Good     Standing balance support: Bilateral upper extremity supported;During functional activity Standing balance-Leahy Scale: Good Standing balance comment: able to shift weight through BLE                           ADL either performed or assessed with clinical judgement   ADL Overall ADL's : Needs assistance/impaired                                       General ADL Comments: Pt currently requires Min A for LB ADL tasks, Mod A for compression stocking mgt, CGA for ADL transfers; pt reports family able to provide needed level of assist at home     Vision Baseline Vision/History: Wears glasses Wears Glasses: Reading only Patient Visual Report: No change from baseline       Perception     Praxis      Pertinent Vitals/Pain Pain Assessment: No/denies pain     Hand Dominance Right   Extremity/Trunk Assessment Upper Extremity Assessment Upper Extremity Assessment: Overall WFL for tasks assessed   Lower Extremity Assessment Lower Extremity Assessment: RLE deficits/detail RLE Deficits / Details: expected post-op strength/ROM deficits  Communication Communication Communication: No difficulties   Cognition Arousal/Alertness: Awake/alert Behavior During Therapy: WFL for tasks assessed/performed Overall Cognitive Status: Within Functional Limits for tasks assessed                                     General Comments       Exercises Total Joint Exercises Ankle Circles/Pumps: AROM;Strengthening;Both;5 reps;10 reps Quad Sets: Strengthening;Both;10 reps Gluteal Sets: Strengthening;Both;10 reps Hip ABduction/ADduction: AAROM;Right;10  reps Straight Leg Raises: AAROM;Right;10 reps Long Arc Quad: AROM;Strengthening;Right;10 reps Knee Flexion: AROM;Strengthening;Right;10 reps Marching in Standing: AROM;Strengthening;Both;Standing;10 reps Other Exercises Other Exercises: Pt instructed in falls prevention, home/routines modifications, AE/DME, and compression stocking mgt; handout provided to maximize recall and carryover   Shoulder Instructions      Home Living Family/patient expects to be discharged to:: Private residence Living Arrangements: Spouse/significant other Available Help at Discharge: Family;Available 24 hours/day Type of Home: House Home Access: Stairs to enter [employer] of Steps: 2 Entrance Stairs-Rails: None Home Layout: One level     Bathroom Shower/Tub: Occupational psychologist: Handicapped height Bathroom Accessibility: No   Home Equipment: Shower seat - built in   Additional Comments: Pt reports bathroom likely too small for a RW to fit in      Prior Functioning/Environment Level of Independence: Independent        Comments: Ind amb without an AD limited community distances, leaned on a shopping cart for grocery store distances, no fall history, Ind with ADLs        OT Problem List: Decreased strength;Decreased range of motion;Impaired balance (sitting and/or standing);Decreased knowledge of use of DME or AE      OT Treatment/Interventions: Self-care/ADL training;Therapeutic exercise;Therapeutic activities;DME and/or AE instruction;Patient/family education;Balance training    OT Goals(Current goals can be found in the care plan section) Acute Rehab OT Goals Patient Stated Goal: To get stronger OT Goal Formulation: With patient Time For Goal Achievement: 08/12/20 Potential to Achieve Goals: Good ADL Goals Pt Will Perform Lower Body Dressing: with modified independence;with adaptive equipment;sit to/from stand Pt Will Transfer to Toilet: with  supervision;ambulating (comfort height toilet, LRAD for ambn) Additional ADL Goal #1: Pt will independently instruct family/caregiver in compression stocking mgt  OT Frequency: Min 2X/week   Barriers to D/C:            Co-evaluation              AM-PAC OT "6 Clicks" Daily Activity     Outcome Measure Help from another person eating meals?: None Help from another person taking care of personal grooming?: None Help from another person toileting, which includes using toliet, bedpan, or urinal?: A Little Help from another person bathing (including washing, rinsing, drying)?: A Little Help from another person to put on and taking off regular upper body clothing?: None Help from another person to put on and taking off regular lower body clothing?: A Little 6 Click Score: 21   End of Session    Activity Tolerance: Patient tolerated treatment well Patient left: in chair;with call bell/phone within reach;with chair alarm set;with SCD's reapplied  OT Visit Diagnosis: Other abnormalities of gait and mobility (R26.89)                Time: 1740-8144 OT Time Calculation (min): 11 min Charges:  OT General Charges $OT Visit: 1 Visit OT Evaluation $OT Eval Low Complexity: 1 Low OT Treatments $Self Care/Home Management :  8-22 mins  Jeni Salles, MPH, MS, OTR/L ascom [phone] 07/29/20, 10:52 AM

## 2020-07-29 NOTE — Progress Notes (Signed)
Physical Therapy Treatment Patient Details Name: Crystal Terrell MRN: [id number] DOB: 12-Aug-1946 Today's Date: 07/29/2020    History of Present Illness Pt is a 74 yo female diagnosed with OA of the R hip and is s/p R THA.  PMH includes: DM, HTN, anemia, gout, DVT, PE, IVC filter placement 07/21/20, and adenoma of the rectum.    PT Comments    Pt was pleasant and motivated to participate during the session. Pt reported no pain at beginning of session. Pt able to perform supine/sitting therex w/ min instructional cueing and min-A for SLR and hip abd. Pt continued to demonstrate mod-I w/ bed mobility; required extra time and effort. Pt had a little more difficulty w/ sit-to-stand this morning from a low bed height, and required a few attempts and min cueing for hand placement before able to come to full stand; once moving pt demonstrated good eccentric control. Pt walked 133ft today; started with slow, step-to, gait pattern and gradually increased cadence and progressed to a step-through pattern. Pt had relatively short bilateral step lengths. SpO2 and HR monitored and remained WFL throughout session. Pt will benefit from HHPT services upon discharge to safely address deficits listed in patient problem list for decreased caregiver assistance and eventual return to PLOF.    Follow Up Recommendations  Home health PT;Supervision for mobility/OOB     Equipment Recommendations  Rolling walker with 5" wheels    Recommendations for Other Services       Precautions / Restrictions Precautions Precautions: Fall;Anterior Hip Precaution Booklet Issued: Yes (comment) Restrictions Weight Bearing Restrictions: Yes RLE Weight Bearing: Weight bearing as tolerated    Mobility  Bed Mobility Overal bed mobility: Modified Independent             General bed mobility comments: Extra time and effort  Transfers Overall transfer level: Needs assistance Equipment used: Rolling walker (2  wheeled) Transfers: Sit to/from Stand Sit to Stand: Min guard         General transfer comment: From low bed height, pt took a few attempts before able to come to standing w/ min cueing for hand placement. Once moving, pt demonstrated good eccentric and concentric control and stability  Ambulation/Gait Ambulation/Gait assistance: Min guard Gait Distance (Feet): 180 Feet Assistive device: Rolling walker (2 wheeled) Gait Pattern/deviations: Step-through pattern;Decreased step length - left;Decreased step length - right Gait velocity: decreased   General Gait Details: Pt began ambulation w/ a slow, step-to, pattern and ~halfway into walk transitioned into a quicker step-through pattern. Pt had small bilateral step lengths throughout   Stairs             Wheelchair Mobility    Modified Rankin (Stroke Patients Only)       Balance Overall balance assessment: Needs assistance Sitting-balance support: Feet supported Sitting balance-Leahy Scale: Good     Standing balance support: Bilateral upper extremity supported;During functional activity Standing balance-Leahy Scale: Good Standing balance comment: able to shift weight through BLE                            Cognition Arousal/Alertness: Awake/alert Behavior During Therapy: WFL for tasks assessed/performed Overall Cognitive Status: Within Functional Limits for tasks assessed                                        Exercises Total Joint Exercises Ankle Circles/Pumps:  AROM;Strengthening;Both;5 reps;10 reps Quad Sets: Strengthening;Both;10 reps Gluteal Sets: Strengthening;Both;10 reps Hip ABduction/ADduction: AAROM;Right;10 reps Straight Leg Raises: AAROM;Right;10 reps Long Arc Quad: AROM;Strengthening;Right;10 reps Knee Flexion: AROM;Strengthening;Right;10 reps Marching in Standing: AROM;Strengthening;Both;Standing;10 reps    General Comments        Pertinent Vitals/Pain Pain  Assessment: No/denies pain    Home Living                      Prior Function            PT Goals (current goals can now be found in the care plan section) Progress towards PT goals: Progressing toward goals    Frequency    BID      PT Plan Current plan remains appropriate    Co-evaluation              AM-PAC PT "6 Clicks" Mobility   Outcome Measure  Help needed turning from your back to your side while in a flat bed without using bedrails?: A Little Help needed moving from lying on your back to sitting on the side of a flat bed without using bedrails?: A Little Help needed moving to and from a bed to a chair (including a wheelchair)?: A Little Help needed standing up from a chair using your arms (e.g., wheelchair or bedside chair)?: A Little Help needed to walk in hospital room?: A Little Help needed climbing 3-5 steps with a railing? : A Lot 6 Click Score: 17    End of Session Equipment Utilized During Treatment: Gait belt Activity Tolerance: Patient tolerated treatment well Patient left: in chair;with call bell/phone within reach;with chair alarm set;with SCD's reapplied Nurse Communication: Mobility status;Weight bearing status PT Visit Diagnosis: Other abnormalities of gait and mobility (R26.89);Muscle weakness (generalized) (M62.81);Pain Pain - Right/Left: Right Pain - part of body: Hip     Time: 7858-8502 PT Time Calculation (min) (ACUTE ONLY): 26 min  Charges:                        Herbert Moors SPT 07/29/20, 9:54 AM

## 2020-07-29 NOTE — Progress Notes (Signed)
Physical Therapy Treatment Patient Details Name: Crystal Terrell MRN: [id number] DOB: [birth year] Today's Date: 07/29/2020    History of Present Illness Pt is a 74 yo female diagnosed with OA of the R hip and is s/p R THA.  PMH includes: DM, HTN, anemia, gout, DVT, PE, IVC filter placement 07/21/20, and adenoma of the rectum.    PT Comments    Pt was pleasant and motivated to participate during the session. Pt reported 1/10 pain this session. Pt required min instructional cueing for seated therex and min-A for SLR. Pt required CGA for sit-to-stand and demonstrated excellent eccentric and concentric control throughout. Pt and daughter given instruction on sequencing for stairs w/o rails; pt was able to ascend and descend 2 stairs x3 w/ RW and demonstrated good eccentric control throughout. Pt then able to ambulate 126ft w/ small bilateral step lengths and minimal BUE support on RW. Pt and caregiver given education on sequencing for car transfers w/ verbalized understanding. Pt will benefit from HHPT services upon discharge to safely address deficits listed in patient problem list for decreased caregiver assistance and eventual return to PLOF.    Home health PT;Supervision for mobility/OOB     Equipment Recommendations  Rolling walker with 5" wheels    Recommendations for Other Services       Precautions / Restrictions Precautions Precautions: Fall;Anterior Hip Precaution Booklet Issued: Yes (comment) Restrictions Weight Bearing Restrictions: Yes RLE Weight Bearing: Weight bearing as tolerated    Mobility  Bed Mobility               General bed mobility comments: deferred, up in recliner  Transfers Overall transfer level: Needs assistance Equipment used: Rolling walker (2 wheeled) Transfers: Sit to/from Stand Sit to Stand: Min guard         General transfer comment: pt demonstrated good eccentric and concentric control and stability  Ambulation/Gait Ambulation/Gait  assistance: Min guard Gait Distance (Feet): 180 Feet Assistive device: Rolling walker (2 wheeled) Gait Pattern/deviations: Step-through pattern;Decreased step length - left;Decreased step length - right Gait velocity: decreased   General Gait Details: Pt ambulated w/ a step-through pattern and small bilateral step lengths. Pt demonstrated good ability to shift weight onto BLE and had minimal BUE support on RW   Stairs Stairs: Yes Stairs assistance: Min guard Stair Management: No rails;Step to pattern;Backwards;Forwards;With walker Number of Stairs: 2x3 General stair comments: ascend backwards, descend forwards. Good eccentric and concentric control throughout   Wheelchair Mobility    Modified Rankin (Stroke Patients Only)       Balance Overall balance assessment: Needs assistance Sitting-balance support: Feet supported Sitting balance-Leahy Scale: Good     Standing balance support: Bilateral upper extremity supported;During functional activity Standing balance-Leahy Scale: Good Standing balance comment: able to shift weight through BLE                            Cognition Arousal/Alertness: Awake/alert Behavior During Therapy: WFL for tasks assessed/performed Overall Cognitive Status: Within Functional Limits for tasks assessed                                        Exercises Total Joint Exercises Ankle Circles/Pumps: AROM;Strengthening;Both;5 reps;10 reps Straight Leg Raises: AAROM;Right;10 reps Long Arc Quad: AROM;Strengthening;Right;10 reps Knee Flexion: AROM;Strengthening;Right;10 reps Marching in Standing: AROM;Strengthening;Both;Standing;10 reps Other Exercises Other Exercises: Pt and caregiver given education on  stair sequencing w/ no rails Other Exercises: Pt and caregiver given education on sequencing for car transfers - verbalized understanding    General Comments        Pertinent Vitals/Pain Pain Assessment: 0-10 Pain  Score: 1  Pain Location: R hip Pain Descriptors / Indicators: Sore Pain Intervention(s): Limited activity within patient's tolerance;Monitored during session;Repositioned    Home Living                      Prior Function            PT Goals (current goals can now be found in the care plan section) Progress towards PT goals: Progressing toward goals    Frequency    BID      PT Plan Current plan remains appropriate    Co-evaluation              AM-PAC PT "6 Clicks" Mobility   Outcome Measure  Help needed turning from your back to your side while in a flat bed without using bedrails?: A Little Help needed moving from lying on your back to sitting on the side of a flat bed without using bedrails?: A Little Help needed moving to and from a bed to a chair (including a wheelchair)?: A Little Help needed standing up from a chair using your arms (e.g., wheelchair or bedside chair)?: A Little Help needed to walk in hospital room?: A Little Help needed climbing 3-5 steps with a railing? : A Little 6 Click Score: 18    End of Session Equipment Utilized During Treatment: Gait belt Activity Tolerance: Patient tolerated treatment well Patient left: in chair;with call bell/phone within reach;with chair alarm set;with SCD's reapplied;with family/visitor present Nurse Communication: Mobility status;Weight bearing status PT Visit Diagnosis: Other abnormalities of gait and mobility (R26.89);Muscle weakness (generalized) (M62.81);Pain Pain - Right/Left: Right Pain - part of body: Hip     Time: 2330-0762 PT Time Calculation (min) (ACUTE ONLY): 29 min  Charges:                        Herbert Moors SPT 07/29/20, 4:13 PM

## 2020-07-30 LAB — CBC
HCT: 25.7 % — ABNORMAL LOW (ref 36.0–46.0)
Hemoglobin: 8.5 g/dL — ABNORMAL LOW (ref 12.0–15.0)
MCH: 28 pg (ref 26.0–34.0)
MCHC: 33.1 g/dL (ref 30.0–36.0)
MCV: 84.5 fL (ref 80.0–100.0)
Platelets: 193 10*3/uL (ref 150–400)
RBC: 3.04 MIL/uL — ABNORMAL LOW (ref 3.87–5.11)
RDW: 13.6 % (ref 11.5–15.5)
WBC: 8.2 10*3/uL (ref 4.0–10.5)
nRBC: 0 % (ref 0.0–0.2)

## 2020-07-30 LAB — GLUCOSE, CAPILLARY
Glucose-Capillary: 130 mg/dL — ABNORMAL HIGH (ref 70–99)
Glucose-Capillary: 174 mg/dL — ABNORMAL HIGH (ref 70–99)
Glucose-Capillary: 157 mg/dL — ABNORMAL HIGH (ref 70–99)

## 2020-07-30 LAB — BASIC METABOLIC PANEL
Anion gap: 9 (ref 5–15)
BUN: 30 mg/dL — ABNORMAL HIGH (ref 8–23)
CO2: 27 mmol/L (ref 22–32)
Calcium: 9 mg/dL (ref 8.9–10.3)
Chloride: 105 mmol/L (ref 98–111)
Creatinine, Ser: 1.3 mg/dL — ABNORMAL HIGH (ref 0.44–1.00)
GFR calc Af Amer: 46 mL/min — ABNORMAL LOW (ref >60)
GFR calc non Af Amer: 40 mL/min — ABNORMAL LOW (ref >60)
Glucose, Bld: 164 mg/dL — ABNORMAL HIGH (ref 70–99)
Potassium: 4.5 mmol/L (ref 3.5–5.1)
Sodium: 141 mmol/L (ref 135–145)

## 2020-07-30 LAB — SURGICAL PATHOLOGY

## 2020-07-30 MED ORDER — ACETAMINOPHEN 500 MG PO TABS: 500.0000 mg | ORAL_TABLET | Freq: Four times a day (QID) | ORAL | Status: AC | PRN

## 2020-07-30 MED ORDER — FE FUMARATE-B12-VIT C-FA-IFC PO CAPS: 1.0000 | ORAL_CAPSULE | Freq: Two times a day (BID) | ORAL | 0 refills | Status: AC

## 2020-07-30 MED ORDER — DOCUSATE SODIUM 100 MG PO CAPS: 100.0000 mg | ORAL_CAPSULE | Freq: Two times a day (BID) | ORAL | 0 refills | Status: AC

## 2020-07-30 MED ORDER — TRAMADOL HCL 50 MG PO TABS: 50.0000 mg | ORAL_TABLET | Freq: Four times a day (QID) | ORAL | 0 refills | Status: AC | PRN

## 2020-07-30 NOTE — TOC Transition Note (Signed)
Transition of Care [facility]) - CM/SW Discharge Note   Patient Details  Name: PETER DAQUILA MRN: [id number] Date of Birth: Jun 29, 1946  [facility] CM/SW Contact:  Candie Chroman, LCSW Phone Number: 07/30/2020, 9:21 AM   Clinical Narrative: Patient has orders to discharge home today. [facility] representative is aware. CSW called patient in room. She said she has the walker that was ordered. Asked if she wanted a 3-in-1 per OT recommendation but patient declined. No further concerns. Patient says she has a ride home today. CSW signing off.    Final next level of care: [place] Barriers to Discharge: Barriers Resolved   Patient Goals and CMS Choice     Choice offered to / list presented to : Patient  Discharge Placement                    Patient and family notified of of transfer: 07/30/20  Discharge Plan and Services   Discharge Planning Services: CM Consult Post Acute Care Choice: Durable Medical Equipment, Home Health          DME Arranged: Gilford Rile rolling DME Agency: [employer] Date DME Agency Contacted: 07/29/20 Time DME Agency Contacted: [phone] Representative spoke with at DME Agency: [place]: PT [place]: [facility] (formerly [employer]) Date [place]: 07/30/20 Time [place]: [phone] Representative spoke with at [place]: [place] (SDOH) Interventions     Readmission Risk Interventions No flowsheet data found.

## 2020-07-30 NOTE — Discharge Summary (Signed)
Physician Discharge Summary  Patient ID: Crystal Terrell MRN: [id number] DOB/AGE: August 16, 1946 74 y.o.  Admit date: 07/28/2020 Discharge date: 07/30/2020  Admission Diagnoses:  S/P hip replacement [Z96.649]   Discharge Diagnoses: Patient Active Problem List   Diagnosis Date Noted  . S/P hip replacement 07/28/2020  . Morbid obesity due to excess calories ([place]) 07/20/2020  . Mixed hyperlipidemia 07/20/2020  . Chronic right hip pain 06/24/2020  . Anemia of chronic disease 05/11/2020  . Essential hypertension   . Hypoxia   . Acute deep vein thrombosis (DVT) of left peroneal vein (HCC)   . Type 2 diabetes mellitus without complication, without long-term current use of insulin ([place])   . Pulmonary embolism ([place]) 12/18/2019  . CKD (chronic kidney disease) stage 3, GFR 30-59 ml/min 03/06/2019  . Systolic murmur 09/32/3557  . Encounter for general adult medical examination without abnormal findings 09/16/2016  . Vaccine counseling 01/14/2016  . Hx of adenomatous colonic polyps 08/01/2014  . History of gout 06/30/2014    Past Medical History:  Diagnosis Date  . Adenoma of rectum   . Anemia   . Diabetes mellitus without complication ([place])   . Diverticulosis   . Ganglion cyst    right  . Gout   . Hypertension   . Mixed hyperlipidemia   . Morbid obesity with BMI of 40.0-44.9, adult ([place])   . Venous insufficiency of lower extremity      Transfusion: None   Consultants (if any):   Discharged Condition: Improved  Hospital Course: [name] is an 74 y.o. female who was admitted 07/28/2020 with a diagnosis of right hip osteoarthritis and went to the operating room on 07/28/2020 and underwent the above named procedures.    Surgeries: Procedure(s): TOTAL HIP ARTHROPLASTY ANTERIOR APPROACH on 07/28/2020 Patient tolerated the surgery well. Taken to PACU where she was stabilized and then transferred to the orthopedic floor.  Started on Eliquis, teds, SCDs. No evidence of DVT.  Negative [place]. Physical therapy started on day #1 for gait training and transfer. OT started day #1 for ADL and assisted devices.  Patient's foley was d/c on day #1. Patient's IV  was d/c on day #2.  On post op day #2 patient was stable and ready for discharge to home with home health PT.  Implants:  Medacta AMIS 1 standard stem with 54 mm Mpact DM cup and liner with metal S 28 mm head   She was given perioperative antibiotics:  Anti-infectives (From admission, onward)   Start     Dose/Rate Route Frequency Ordered Stop   07/28/20 0800  ceFAZolin (ANCEF) IVPB 2g/100 mL premix        2 g 200 mL/hr over 30 Minutes Intravenous On call to O.R. 07/28/20 0749 07/28/20 1108   07/28/20 0759  ceFAZolin (ANCEF) 2-4 GM/100ML-% IVPB       Note to Pharmacy: Myles Lipps   : cabinet override      07/28/20 0759 07/28/20 1120    .  She was given sequential compression devices, early ambulation, and Eliquis, teds for DVT prophylaxis.  She benefited maximally from the hospital stay and there were no complications.    Recent vital signs:  Vitals:   07/30/20 0043 07/30/20 0734  BP: (!) 125/98 127/67  Pulse: 68 (!) 59  Resp: 18 15  Temp: 98.1 F (36.7 C) 98.7 F (37.1 C)  SpO2: 100% 100%    Recent laboratory studies:  Lab Results  Component Value Date   HGB 8.5 (L) 07/30/2020  HGB 9.7 (L) 07/29/2020   HGB 11.5 (L) 07/22/2020   Lab Results  Component Value Date   WBC 8.2 07/30/2020   PLT 193 07/30/2020   Lab Results  Component Value Date   INR 1.0 12/18/2019   Lab Results  Component Value Date   NA 141 07/30/2020   K 4.5 07/30/2020   CL 105 07/30/2020   CO2 27 07/30/2020   BUN 30 (H) 07/30/2020   CREATININE 1.30 (H) 07/30/2020   GLUCOSE 164 (H) 07/30/2020    Discharge Medications:   Allergies as of 07/30/2020      Reactions   Metformin    Makes her feel sick   Sulfa Antibiotics Nausea And Vomiting      Medication List    TAKE these medications   acetaminophen  500 MG tablet Commonly known as: TYLENOL Take 1-2 tablets (500-1,000 mg total) by mouth every 6 (six) hours as needed for mild pain (pain score 1-3 or temp > 100.5).   allopurinol 100 MG tablet Commonly known as: ZYLOPRIM Take 200 mg by mouth 2 (two) times daily.   amLODipine 10 MG tablet Commonly known as: NORVASC Take 10 mg by mouth every evening.   apixaban 5 MG Tabs tablet Commonly known as: ELIQUIS Take 2 tablets (10 mg total) by mouth 2 (two) times daily. And from 12/27/19 take 5 mg (1 tab) two times a day   docusate sodium 100 MG capsule Commonly known as: COLACE Take 1 capsule (100 mg total) by mouth 2 (two) times daily.   Dulaglutide 1.5 MG/0.5ML Sopn Inject 1.5 mg into the skin every Friday.   fenofibrate 160 MG tablet Take 160 mg by mouth daily.   ferrous [id number] C-folic acid capsule Commonly known as: TRINSICON / FOLTRIN Take 1 capsule by mouth 2 (two) times daily.   furosemide 40 MG tablet Commonly known as: LASIX Take 40 mg by mouth daily.   glimepiride 4 MG tablet Commonly known as: AMARYL Take 1 tablet (4 mg total) by mouth daily with breakfast.   losartan 100 MG tablet Commonly known as: COZAAR Take 100 mg by mouth daily.   lovastatin 20 MG tablet Commonly known as: MEVACOR Take 20 mg by mouth daily.   metoprolol succinate 100 MG 24 hr tablet Commonly known as: TOPROL-XL Take 100 mg by mouth daily. Take with or immediately following a meal.   MUSCLE RUB EX Apply 1 application topically daily as needed (pain).   Omega-3 1000 MG Caps Take 1,000 mg by mouth 2 (two) times daily.   traMADol 50 MG tablet Commonly known as: ULTRAM Take 1 tablet (50 mg total) by mouth every 6 (six) hours as needed.            Durable Medical Equipment  (From admission, onward)         Start     Ordered   07/28/20 1440  DME Walker rolling  Once       Question Answer Comment  Walker: With 5 Inch Wheels   Patient needs a walker to treat with  the following condition S/P hip replacement      07/28/20 1439   07/28/20 1440  DME 3 n 1  Once        07/28/20 1439   07/28/20 1440  DME Bedside commode  Once       Question:  Patient needs a bedside commode to treat with the following condition  Answer:  S/P hip replacement   07/28/20 1439  Diagnostic Studies: PERIPHERAL VASCULAR CATHETERIZATION  Result Date: 07/21/2020 See Op Note  DG HIP OPERATIVE UNILAT W OR W/O PELVIS RIGHT  Result Date: 07/28/2020 CLINICAL DATA:  Status post total hip replacement EXAM: OPERATIVE RIGHT HIP  2 VIEWS TECHNIQUE: Fluoroscopic spot image(s) were submitted for interpretation post-operatively. COMPARISON:  None. FLUOROSCOPY TIME:  0 minutes 18 seconds; 2 acquired images FINDINGS: Initial frontal view shows moderate narrowing of the right hip joint. No fracture or dislocation evident. Subsequent frontal image shows total hip replacement on the right with visualized prosthetic components well-seated on frontal view. No fracture or dislocation evident. IMPRESSION: Total hip replacement right with visualized prosthetic components well-seated on frontal view. No fracture or dislocation evident. Electronically Signed   By: Lowella Grip III M.D.   On: 07/28/2020 13:07   DG HIP UNILAT W OR W/O PELVIS 2-3 VIEWS RIGHT  Result Date: 07/28/2020 CLINICAL DATA:  Status post right hip replacement EXAM: DG HIP (WITH OR WITHOUT PELVIS) 2-3V RIGHT COMPARISON:  Intraop films from earlier in the same day. FINDINGS: Right hip prosthesis is noted in satisfactory position. No acute fracture or dislocation is noted. No soft tissue abnormality is seen. IMPRESSION: Status post right hip replacement. Electronically Signed   By: Inez Catalina M.D.   On: 07/28/2020 13:28    Disposition:  There are no questions and answers to display.             Signed: Dorise Hiss CHRISTOPHER 07/30/2020, 9:11 AM

## 2020-07-30 NOTE — Progress Notes (Signed)
Patient discharge instructions reviewed. Prescriptions given to patient. IV L hand removed. Family member in room to pick up patient. VS stable no sxs of distress.

## 2020-07-30 NOTE — Progress Notes (Signed)
   Subjective: 2 Days Post-Op Procedure(s) (LRB): TOTAL HIP ARTHROPLASTY ANTERIOR APPROACH (Right) Patient reports pain as mild.   Patient is well, and has had no acute complaints or problems Denies any CP, SOB, ABD pain. We will continue therapy today.  Plan is to go Home after hospital stay.  Objective: Vital signs in last 24 hours: Temp:  [97.9 F (36.6 C)-98.7 F (37.1 C)] 98.7 F (37.1 C) (08/12 0734) Pulse Rate:  [59-68] 59 (08/12 0734) Resp:  [15-18] 15 (08/12 0734) BP: (100-127)/(64-98) 127/67 (08/12 0734) SpO2:  [96 %-100 %] 100 % (08/12 0734)  Intake/Output from previous day: 08/11 0701 - 08/12 0700 In: 480 [P.O.:480] Out: 0  Intake/Output this shift: No intake/output data recorded.  Recent Labs    07/29/20 0406 07/30/20 0415  HGB 9.7* 8.5*   Recent Labs    07/29/20 0406 07/30/20 0415  WBC 7.5 8.2  RBC 3.45* 3.04*  HCT 28.7* 25.7*  PLT 208 193   Recent Labs    07/29/20 0406 07/30/20 0415  NA 138 141  K 4.0 4.5  CL 107 105  CO2 23 27  BUN 28* 30*  CREATININE 1.61* 1.30*  GLUCOSE 265* 164*  CALCIUM 8.6* 9.0   No results for input(s): LABPT, INR in the last 72 hours.  EXAM General - Patient is Alert, Appropriate and Oriented Extremity - Neurovascular intact Sensation intact distally Intact pulses distally Dorsiflexion/Plantar flexion intact No cellulitis present Compartment soft Dressing - dressing C/D/I and no drainage, prevena intact with out drainage Motor Function - intact, moving foot and toes well on exam.   Past Medical History:  Diagnosis Date  . Adenoma of rectum   . Anemia   . Diabetes mellitus without complication ([place])   . Diverticulosis   . Ganglion cyst    right  . Gout   . Hypertension   . Mixed hyperlipidemia   . Morbid obesity with BMI of 40.0-44.9, adult ([place])   . Venous insufficiency of lower extremity     Assessment/Plan:   2 Days Post-Op Procedure(s) (LRB): TOTAL HIP ARTHROPLASTY ANTERIOR APPROACH  (Right) Active Problems:   S/P hip replacement  Estimated body mass index is 39.11 kg/m as calculated from the following:   Height as of this encounter: 5\' 2"  (1.575 m).   Weight as of this encounter: 97 kg. Advance diet Up with therapy  Needs BM Labs and vital signs stable Acute post op blood loss anemia -hemoglobin 8.5.  Patient asymptomatic.  Vital signs are stable.  We will continue with iron supplement for 1 week CM to assist with discharge to home with home health PT today pending bowel movement and continued progress of physical therapy  DVT Prophylaxis - TED hose and SCDs, Eliquis Weight-Bearing as tolerated to right leg   T. Rachelle Hora, PA-C [place] 07/30/2020, 9:07 AM

## 2020-07-30 NOTE — Discharge Instructions (Signed)

## 2020-07-30 NOTE — Progress Notes (Signed)
Physical Therapy Treatment Patient Details Name: Crystal Terrell MRN: [id number] DOB: Aug 25, 1946 Today's Date: 07/30/2020    History of Present Illness Pt is a 74 yo female diagnosed with OA of the R hip and is s/p R THA.  PMH includes: DM, HTN, anemia, gout, DVT, PE, IVC filter placement 07/21/20, and adenoma of the rectum.    PT Comments    Pt was pleasant and motivated to participate during the session. Pt found in supine and continued to report no pain this session. Pt required no physical assist for supine therex this session. Pt required min-A w/ supine-to-sit to the L side of bed to initiate movement w/ RLE; pt was mod-I in previous sessions supine-sit to R side of bed. Pt mod-I and required extra time and effort for sit-to-supine for repositioning. Pt performed multiple sit-to-stand transfers to RW throughout session w/ CGA; demonstrated good eccentric and concentric control throughout. Pt demonstrated excellent carryover from yesterday's stair training and was able to ascend & descend 2x stairs w/ RW and CGA; no need for cueing and pt continued to demonstrate good eccentric control. Pt ambulated 148ft w/ step-through pattern and small bilateral steps; good ability to shift weight onto BLE and minimal BUE support on RW. Pt will benefit from HHPT services upon discharge to safely address deficits listed in patient problem list for decreased caregiver assistance and eventual return to PLOF.     Follow Up Recommendations  Home health PT;Supervision for mobility/OOB     Equipment Recommendations  Rolling walker with 5" wheels    Recommendations for Other Services       Precautions / Restrictions Precautions Precautions: Fall;Anterior Hip Precaution Booklet Issued: Yes (comment) Restrictions Weight Bearing Restrictions: Yes RLE Weight Bearing: Weight bearing as tolerated    Mobility  Bed Mobility Overal bed mobility: Needs Assistance Bed Mobility: Supine to Sit;Sit to Supine      Supine to sit: Min assist;HOB elevated Sit to supine: Modified independent (Device/Increase time)   General bed mobility comments: Got up on L side of bed today, pt required min-A to initiate movement w/ RLE to EOB (previously mod-I to R side of bed); extra time and effort for repositioning in supine  Transfers Overall transfer level: Needs assistance Equipment used: Rolling walker (2 wheeled) Transfers: Sit to/from Stand Sit to Stand: Min guard         General transfer comment: pt demonstrated good eccentric and concentric control and stability w/ multiple transfers throughout session  Ambulation/Gait Ambulation/Gait assistance: Min guard Gait Distance (Feet): 180 Feet Assistive device: Rolling walker (2 wheeled) Gait Pattern/deviations: Step-through pattern;Decreased step length - left;Decreased step length - right Gait velocity: decreased   General Gait Details: Pt ambulated w/ a step-through pattern and small bilateral step lengths. Pt demonstrated good ability to shift weight onto BLE and had minimal BUE support on RW   Stairs Stairs: Yes Stairs assistance: Min guard Stair Management: No rails;Step to pattern;Backwards;Forwards;With walker Number of Stairs: 2 General stair comments: ascend backwards, descend forwards. Good eccentric and concentric control throughout   Wheelchair Mobility    Modified Rankin (Stroke Patients Only)       Balance Overall balance assessment: Needs assistance Sitting-balance support: Feet supported Sitting balance-Leahy Scale: Good     Standing balance support: Bilateral upper extremity supported;During functional activity Standing balance-Leahy Scale: Good Standing balance comment: able to shift weight through BLE; minimal BUE support on RW  Cognition Arousal/Alertness: Awake/alert Behavior During Therapy: WFL for tasks assessed/performed Overall Cognitive Status: Within Functional Limits  for tasks assessed                                        Exercises Total Joint Exercises Ankle Circles/Pumps: AROM;Strengthening;Both;5 reps;10 reps Quad Sets: Strengthening;Both;10 reps Gluteal Sets: Strengthening;Both;10 reps Marching in Standing: AROM;Strengthening;Both;Standing;10 reps    General Comments        Pertinent Vitals/Pain Pain Assessment: No/denies pain    Home Living                      Prior Function            PT Goals (current goals can now be found in the care plan section) Progress towards PT goals: Progressing toward goals    Frequency    BID      PT Plan Current plan remains appropriate    Co-evaluation              AM-PAC PT "6 Clicks" Mobility   Outcome Measure  Help needed turning from your back to your side while in a flat bed without using bedrails?: A Little Help needed moving from lying on your back to sitting on the side of a flat bed without using bedrails?: A Little Help needed moving to and from a bed to a chair (including a wheelchair)?: A Little Help needed standing up from a chair using your arms (e.g., wheelchair or bedside chair)?: A Little Help needed to walk in hospital room?: A Little Help needed climbing 3-5 steps with a railing? : A Little 6 Click Score: 18    End of Session Equipment Utilized During Treatment: Gait belt Activity Tolerance: Patient tolerated treatment well Patient left: in bed;with call bell/phone within reach;with bed alarm set;with nursing/sitter in room;with SCD's reapplied Nurse Communication: Mobility status;Weight bearing status PT Visit Diagnosis: Other abnormalities of gait and mobility (R26.89);Muscle weakness (generalized) (M62.81);Pain Pain - Right/Left: Right Pain - part of body: Hip     Time: 8984-2103 PT Time Calculation (min) (ACUTE ONLY): 25 min  Charges:                        Tavin Vernet SPT 07/30/20, 1:00 PM

## 2020-07-31 ENCOUNTER — Other Ambulatory Visit: Payer: Medicare Other

## 2020-08-17 NOTE — Interval H&P Note (Signed)
History and Physical Interval Note:  08/17/2020 4:49 PM  Crystal Terrell  has presented today for surgery, with the diagnosis of IVC filter placement   DVT    Covid Aug 2.  The various methods of treatment have been discussed with the patient and family. After consideration of risks, benefits and other options for treatment, the patient has consented to  Procedure(s): IVC FILTER INSERTION (N/A) as a surgical intervention.  The patient's history has been reviewed, patient examined, no change in status, stable for surgery.  I have reviewed the patient's chart and labs.  Questions were answered to the patient's satisfaction.     [name]

## 2020-08-28 ENCOUNTER — Other Ambulatory Visit: Payer: Self-pay | Admitting: Family Medicine

## 2020-08-28 DIAGNOSIS — Z1231 Encounter for screening mammogram for malignant neoplasm of breast: Secondary | ICD-10-CM

## 2020-09-15 ENCOUNTER — Ambulatory Visit
Admission: RE | Admit: 2020-09-15 | Discharge: 2020-09-15 | Disposition: A | Discharge status: Home / Self Care | Payer: Medicare Other | Source: Ambulatory Visit | Attending: Family Medicine | Admitting: Family Medicine

## 2020-09-15 ENCOUNTER — Other Ambulatory Visit: Payer: Self-pay

## 2020-09-15 DIAGNOSIS — Z1231 Encounter for screening mammogram for malignant neoplasm of breast: Secondary | ICD-10-CM | POA: Diagnosis not present

## 2020-09-16 ENCOUNTER — Other Ambulatory Visit (INDEPENDENT_AMBULATORY_CARE_PROVIDER_SITE_OTHER): Payer: Self-pay | Admitting: Vascular Surgery

## 2020-09-16 DIAGNOSIS — I82409 Acute embolism and thrombosis of unspecified deep veins of unspecified lower extremity: Secondary | ICD-10-CM

## 2020-09-16 DIAGNOSIS — Z95828 Presence of other vascular implants and grafts: Secondary | ICD-10-CM

## 2020-09-21 ENCOUNTER — Telehealth (INDEPENDENT_AMBULATORY_CARE_PROVIDER_SITE_OTHER): Payer: Self-pay

## 2020-09-21 ENCOUNTER — Ambulatory Visit (INDEPENDENT_AMBULATORY_CARE_PROVIDER_SITE_OTHER): Payer: Medicare Other | Admitting: Vascular Surgery

## 2020-09-21 ENCOUNTER — Other Ambulatory Visit: Payer: Self-pay

## 2020-09-21 ENCOUNTER — Ambulatory Visit (INDEPENDENT_AMBULATORY_CARE_PROVIDER_SITE_OTHER): Payer: Medicare Other

## 2020-09-21 ENCOUNTER — Encounter (INDEPENDENT_AMBULATORY_CARE_PROVIDER_SITE_OTHER): Payer: Self-pay | Admitting: Vascular Surgery

## 2020-09-21 VITALS — BP 129/69 | HR 64 | Resp 16 | Wt 209.0 lb

## 2020-09-21 DIAGNOSIS — I2782 Chronic pulmonary embolism: Secondary | ICD-10-CM | POA: Diagnosis not present

## 2020-09-21 DIAGNOSIS — Z95828 Presence of other vascular implants and grafts: Secondary | ICD-10-CM

## 2020-09-21 DIAGNOSIS — I82409 Acute embolism and thrombosis of unspecified deep veins of unspecified lower extremity: Secondary | ICD-10-CM

## 2020-09-21 DIAGNOSIS — E1169 Type 2 diabetes mellitus with other specified complication: Secondary | ICD-10-CM | POA: Diagnosis not present

## 2020-09-21 DIAGNOSIS — Z96641 Presence of right artificial hip joint: Secondary | ICD-10-CM

## 2020-09-21 DIAGNOSIS — I10 Essential (primary) hypertension: Secondary | ICD-10-CM

## 2020-09-21 DIAGNOSIS — E785 Hyperlipidemia, unspecified: Secondary | ICD-10-CM

## 2020-09-21 DIAGNOSIS — E782 Mixed hyperlipidemia: Secondary | ICD-10-CM

## 2020-09-21 NOTE — H&P (View-Only) (Signed)
MRN : [id number]  Crystal Terrell is a 74 y.o. (Feb 08, 1946) female who presents with chief complaint of  Chief Complaint  Patient presents with  . Follow-up    [facility] 63month post ivc filter insertion  .  History of Present Illness:    The patient presents to the office for evaluation of of her IVC filter.    She is status post IVC filter placement on July 21, 2020 in preparation for her right hip replacement.  The right total hip arthroplasty with an anterior approach was performed on July 28, 2020.  There have been no complications.  She now returns for discussion regarding removal.  The patient notes the leg continues to be very painful with dependency and swells quite a bite.  Symptoms are much better with elevation.  The patient notes minimal edema in the morning which steadily worsens throughout the day.    The patient has not been using compression therapy at this point.  No SOB or pleuritic chest pains.  No cough or hemoptysis.  No blood per rectum or blood in any sputum.  No excessive bruising per the patient.  Duplex ultrasound obtained today demonstrates widely patent deep venous system.  No evidence of recurrent DVT.    Current Meds  Medication Sig  . acetaminophen (TYLENOL) 500 MG tablet Take 1-2 tablets (500-1,000 mg total) by mouth every 6 (six) hours as needed for mild pain (pain score 1-3 or temp > 100.5).  Marland Kitchen allopurinol (ZYLOPRIM) 100 MG tablet Take 200 mg by mouth 2 (two) times daily.   Marland Kitchen amLODipine (NORVASC) 10 MG tablet Take 10 mg by mouth every evening.   Marland Kitchen apixaban (ELIQUIS) 5 MG TABS tablet Take 2 tablets (10 mg total) by mouth 2 (two) times daily. And from 12/27/19 take 5 mg (1 tab) two times a day  . docusate sodium (COLACE) 100 MG capsule Take 1 capsule (100 mg total) by mouth 2 (two) times daily.  . Dulaglutide 1.5 MG/0.5ML SOPN Inject 1.5 mg into the skin every Friday.   . fenofibrate 160 MG tablet Take 160 mg by mouth daily.  . ferrous  [id number] C-folic acid (TRINSICON / FOLTRIN) capsule Take 1 capsule by mouth 2 (two) times daily.  . furosemide (LASIX) 40 MG tablet Take 40 mg by mouth daily.   Marland Kitchen glimepiride (AMARYL) 4 MG tablet Take 1 tablet (4 mg total) by mouth daily with breakfast.  . losartan (COZAAR) 100 MG tablet Take 100 mg by mouth daily.  Marland Kitchen lovastatin (MEVACOR) 20 MG tablet Take 20 mg by mouth daily.  . metoprolol succinate (TOPROL-XL) 100 MG 24 hr tablet Take 100 mg by mouth daily. Take with or immediately following a meal.  . Omega-3 1000 MG CAPS Take 1,000 mg by mouth 2 (two) times daily.  . traMADol (ULTRAM) 50 MG tablet Take 1 tablet (50 mg total) by mouth every 6 (six) hours as needed.    Past Medical History:  Diagnosis Date  . Adenoma of rectum   . Anemia   . Diabetes mellitus without complication ([place])   . Diverticulosis   . Ganglion cyst    right  . Gout   . Hypertension   . Mixed hyperlipidemia   . Morbid obesity with BMI of 40.0-44.9, adult ([place])   . Venous insufficiency of lower extremity     Past Surgical History:  Procedure Laterality Date  . ABDOMINAL HYSTERECTOMY    . COLONOSCOPY    . COLONOSCOPY WITH PROPOFOL N/A 11/07/2019  Procedure: COLONOSCOPY WITH PROPOFOL;  Surgeon: Toledo, Benay Pike, MD;  Location: [facility] ENDOSCOPY;  Service: Gastroenterology;  Laterality: N/A;  . HYSTEROSCOPY WITH D & C    . IVC FILTER INSERTION N/A 07/21/2020   Procedure: IVC FILTER INSERTION;  Surgeon: Katha Cabal, MD;  Location: [place] [facility];  Service: Cardiovascular;  Laterality: N/A;  . TOTAL HIP ARTHROPLASTY Right 07/28/2020   Procedure: TOTAL HIP ARTHROPLASTY ANTERIOR APPROACH;  Surgeon: Hessie Knows, MD;  Location: [facility] ORS;  Service: Orthopedics;  Laterality: Right;  . TUBAL LIGATION      Social History Social History   Tobacco Use  . Smoking status: Never Smoker  . Smokeless tobacco: Never Used  Vaping Use  . Vaping Use: Never used  Substance Use Topics  . Alcohol  use: Not Currently  . Drug use: Never    Family History Family History  Problem Relation Age of Onset  . Breast cancer Neg Hx     Allergies  Allergen Reactions  . Metformin     Makes her feel sick  . Sulfa Antibiotics Nausea And Vomiting     REVIEW OF SYSTEMS (Negative unless checked)  Constitutional: [] Weight loss  [] Fever  [] Chills Cardiac: [] Chest pain   [] Chest pressure   [] Palpitations   [] Shortness of breath when laying flat   [] Shortness of breath with exertion. Vascular:  [] Pain in legs with walking   [] Pain in legs at rest  [x] History of DVT   [] Phlebitis   [x] Swelling in legs   [] Varicose veins   [] Non-healing ulcers Pulmonary:   [] Uses home oxygen   [] Productive cough   [] Hemoptysis   [] Wheeze  [] COPD   [] Asthma Neurologic:  [] Dizziness   [] Seizures   [] History of stroke   [] History of TIA  [] Aphasia   [] Vissual changes   [] Weakness or numbness in arm   [] Weakness or numbness in leg Musculoskeletal:   [] Joint swelling   [x] Joint pain   [] Low back pain Hematologic:  [] Easy bruising  [] Easy bleeding   [] Hypercoagulable state   [] Anemic Gastrointestinal:  [] Diarrhea   [] Vomiting  [] Gastroesophageal reflux/heartburn   [] Difficulty swallowing. Genitourinary:  [] Chronic kidney disease   [] Difficult urination  [] Frequent urination   [] Blood in urine Skin:  [] Rashes   [] Ulcers  Psychological:  [] History of anxiety   []  History of major depression.  Physical Examination  Vitals:   09/21/20 1142  BP: 129/69  Pulse: 64  Resp: 16  Weight: 209 lb (94.8 kg)   Body mass index is 38.23 kg/m. Gen: WD/WN, NAD Head: [place]/AT, No temporalis wasting.  Ear/Nose/Throat: Hearing grossly intact, nares w/o erythema or drainage Eyes: PER, EOMI, sclera nonicteric.  Neck: Supple, no large masses.   Pulmonary:  Good air movement, no audible wheezing bilaterally, no use of accessory muscles.  Cardiac: RRR, no JVD Vascular: scattered varicosities present bilaterally.  Mild venous stasis  changes to the legs bilaterally.  1-2+ soft pitting edema. Vessel Right Left  Radial Palpable Palpable  Gastrointestinal: Non-distended. No guarding/no peritoneal signs.  Musculoskeletal: M/S 5/5 throughout.  No deformity or atrophy.  Neurologic: CN 2-12 intact. Symmetrical.  Speech is fluent. Motor exam as listed above. Psychiatric: Judgment intact, Mood & affect appropriate for pt's clinical situation. Dermatologic: No rashes or ulcers noted.  No changes consistent with cellulitis.   CBC Lab Results  Component Value Date   WBC 8.2 07/30/2020   HGB 8.5 (L) 07/30/2020   HCT 25.7 (L) 07/30/2020   MCV 84.5 07/30/2020   PLT 193 07/30/2020  BMET    Component Value Date/Time   NA 141 07/30/2020 0415   K 4.5 07/30/2020 0415   CL 105 07/30/2020 0415   CO2 27 07/30/2020 0415   GLUCOSE 164 (H) 07/30/2020 0415   BUN 30 (H) 07/30/2020 0415   CREATININE 1.30 (H) 07/30/2020 0415   CALCIUM 9.0 07/30/2020 0415   GFRNONAA 40 (L) 07/30/2020 0415   GFRAA 46 (L) 07/30/2020 0415   CrCl cannot be calculated (Patient's most recent lab result is older than the maximum 21 days allowed.).  COAG Lab Results  Component Value Date   INR 1.0 12/18/2019    Radiology MM 3D SCREEN BREAST BILATERAL  Result Date: 09/16/2020 CLINICAL DATA:  Screening. EXAM: DIGITAL SCREENING BILATERAL MAMMOGRAM WITH TOMO AND CAD COMPARISON:  Previous exam(s). ACR Breast Density Category c: The breast tissue is heterogeneously dense, which may obscure small masses. FINDINGS: There are no findings suspicious for malignancy. Images were processed with CAD. IMPRESSION: No mammographic evidence of malignancy. A result letter of this screening mammogram will be mailed directly to the patient. RECOMMENDATION: Screening mammogram in one year. (Code:SM-B-01Y) BI-RADS CATEGORY  1: Negative. Electronically Signed   By: Nolon Nations M.D.   On: 09/16/2020 08:29     Assessment/Plan 1. Other chronic pulmonary embolism without  acute cor pulmonale (HCC) The patient will continue anticoagulation for now as there have not been any problems or complications at this point.  IVC filter should be removed.  IVC filter will be removed in the next week or two. Risk and benefits were reviewed the patient.  Indications for the procedure were reviewed.  All questions were answered, the patient agrees to proceed.   I have had a long discussion with the patient regarding DVT and post phlebitic changes such as swelling and why it  causes symptoms such as pain.  The patient will wear graduated compression stockings class 1 (20-30 mmHg) on a daily basis a prescription was given. The patient will  beginning wearing the stockings first thing in the morning and removing them in the evening. The patient is instructed specifically not to sleep in the stockings.  In addition, behavioral modification including elevation during the day and avoidance of prolonged dependency will be initiated.    The patient will follow-up with me in 3 months after the joint replacement surgery to discuss removal (this was also discussed today and the patient agrees with the plan to have the filter removed).    2. Status post right hip replacement SHe has done well   Plan per Dr Rudene Christians  3. Essential hypertension Continue antihypertensive medications as already ordered, these medications have been reviewed and there are no changes at this time.   4. Type 2 diabetes mellitus with hyperlipidemia ([place]) Continue hypoglycemic medications as already ordered, these medications have been reviewed and there are no changes at this time.  Hgb A1C to be monitored as already arranged by primary service   5. Mixed hyperlipidemia Continue statin as ordered and reviewed, no changes at this time     [name], MD  09/21/2020 11:59 AM

## 2020-09-21 NOTE — Progress Notes (Signed)
MRN : [id number]  Crystal Terrell is a 74 y.o. (28-Mar-1946) female who presents with chief complaint of  Chief Complaint  Patient presents with  . Follow-up    [facility] 30month post ivc filter insertion  .  History of Present Illness:    The patient presents to the office for evaluation of of her IVC filter.    She is status post IVC filter placement on July 21, 2020 in preparation for her right hip replacement.  The right total hip arthroplasty with an anterior approach was performed on July 28, 2020.  There have been no complications.  She now returns for discussion regarding removal.  The patient notes the leg continues to be very painful with dependency and swells quite a bite.  Symptoms are much better with elevation.  The patient notes minimal edema in the morning which steadily worsens throughout the day.    The patient has not been using compression therapy at this point.  No SOB or pleuritic chest pains.  No cough or hemoptysis.  No blood per rectum or blood in any sputum.  No excessive bruising per the patient.  Duplex ultrasound obtained today demonstrates widely patent deep venous system.  No evidence of recurrent DVT.    Current Meds  Medication Sig  . acetaminophen (TYLENOL) 500 MG tablet Take 1-2 tablets (500-1,000 mg total) by mouth every 6 (six) hours as needed for mild pain (pain score 1-3 or temp > 100.5).  Marland Kitchen allopurinol (ZYLOPRIM) 100 MG tablet Take 200 mg by mouth 2 (two) times daily.   Marland Kitchen amLODipine (NORVASC) 10 MG tablet Take 10 mg by mouth every evening.   Marland Kitchen apixaban (ELIQUIS) 5 MG TABS tablet Take 2 tablets (10 mg total) by mouth 2 (two) times daily. And from 12/27/19 take 5 mg (1 tab) two times a day  . docusate sodium (COLACE) 100 MG capsule Take 1 capsule (100 mg total) by mouth 2 (two) times daily.  . Dulaglutide 1.5 MG/0.5ML SOPN Inject 1.5 mg into the skin every Friday.   . fenofibrate 160 MG tablet Take 160 mg by mouth daily.  . ferrous  [id number] C-folic acid (TRINSICON / FOLTRIN) capsule Take 1 capsule by mouth 2 (two) times daily.  . furosemide (LASIX) 40 MG tablet Take 40 mg by mouth daily.   Marland Kitchen glimepiride (AMARYL) 4 MG tablet Take 1 tablet (4 mg total) by mouth daily with breakfast.  . losartan (COZAAR) 100 MG tablet Take 100 mg by mouth daily.  Marland Kitchen lovastatin (MEVACOR) 20 MG tablet Take 20 mg by mouth daily.  . metoprolol succinate (TOPROL-XL) 100 MG 24 hr tablet Take 100 mg by mouth daily. Take with or immediately following a meal.  . Omega-3 1000 MG CAPS Take 1,000 mg by mouth 2 (two) times daily.  . traMADol (ULTRAM) 50 MG tablet Take 1 tablet (50 mg total) by mouth every 6 (six) hours as needed.    Past Medical History:  Diagnosis Date  . Adenoma of rectum   . Anemia   . Diabetes mellitus without complication ([place])   . Diverticulosis   . Ganglion cyst    right  . Gout   . Hypertension   . Mixed hyperlipidemia   . Morbid obesity with BMI of 40.0-44.9, adult ([place])   . Venous insufficiency of lower extremity     Past Surgical History:  Procedure Laterality Date  . ABDOMINAL HYSTERECTOMY    . COLONOSCOPY    . COLONOSCOPY WITH PROPOFOL N/A 11/07/2019  Procedure: COLONOSCOPY WITH PROPOFOL;  Surgeon: Toledo, Benay Pike, MD;  Location: [facility] ENDOSCOPY;  Service: Gastroenterology;  Laterality: N/A;  . HYSTEROSCOPY WITH D & C    . IVC FILTER INSERTION N/A 07/21/2020   Procedure: IVC FILTER INSERTION;  Surgeon: Katha Cabal, MD;  Location: [place] [facility];  Service: Cardiovascular;  Laterality: N/A;  . TOTAL HIP ARTHROPLASTY Right 07/28/2020   Procedure: TOTAL HIP ARTHROPLASTY ANTERIOR APPROACH;  Surgeon: Hessie Knows, MD;  Location: [facility] ORS;  Service: Orthopedics;  Laterality: Right;  . TUBAL LIGATION      Social History Social History   Tobacco Use  . Smoking status: Never Smoker  . Smokeless tobacco: Never Used  Vaping Use  . Vaping Use: Never used  Substance Use Topics  . Alcohol  use: Not Currently  . Drug use: Never    Family History Family History  Problem Relation Age of Onset  . Breast cancer Neg Hx     Allergies  Allergen Reactions  . Metformin     Makes her feel sick  . Sulfa Antibiotics Nausea And Vomiting     REVIEW OF SYSTEMS (Negative unless checked)  Constitutional: [] Weight loss  [] Fever  [] Chills Cardiac: [] Chest pain   [] Chest pressure   [] Palpitations   [] Shortness of breath when laying flat   [] Shortness of breath with exertion. Vascular:  [] Pain in legs with walking   [] Pain in legs at rest  [x] History of DVT   [] Phlebitis   [x] Swelling in legs   [] Varicose veins   [] Non-healing ulcers Pulmonary:   [] Uses home oxygen   [] Productive cough   [] Hemoptysis   [] Wheeze  [] COPD   [] Asthma Neurologic:  [] Dizziness   [] Seizures   [] History of stroke   [] History of TIA  [] Aphasia   [] Vissual changes   [] Weakness or numbness in arm   [] Weakness or numbness in leg Musculoskeletal:   [] Joint swelling   [x] Joint pain   [] Low back pain Hematologic:  [] Easy bruising  [] Easy bleeding   [] Hypercoagulable state   [] Anemic Gastrointestinal:  [] Diarrhea   [] Vomiting  [] Gastroesophageal reflux/heartburn   [] Difficulty swallowing. Genitourinary:  [] Chronic kidney disease   [] Difficult urination  [] Frequent urination   [] Blood in urine Skin:  [] Rashes   [] Ulcers  Psychological:  [] History of anxiety   []  History of major depression.  Physical Examination  Vitals:   09/21/20 1142  BP: 129/69  Pulse: 64  Resp: 16  Weight: 209 lb (94.8 kg)   Body mass index is 38.23 kg/m. Gen: WD/WN, NAD Head: [place]/AT, No temporalis wasting.  Ear/Nose/Throat: Hearing grossly intact, nares w/o erythema or drainage Eyes: PER, EOMI, sclera nonicteric.  Neck: Supple, no large masses.   Pulmonary:  Good air movement, no audible wheezing bilaterally, no use of accessory muscles.  Cardiac: RRR, no JVD Vascular: scattered varicosities present bilaterally.  Mild venous stasis  changes to the legs bilaterally.  1-2+ soft pitting edema. Vessel Right Left  Radial Palpable Palpable  Gastrointestinal: Non-distended. No guarding/no peritoneal signs.  Musculoskeletal: M/S 5/5 throughout.  No deformity or atrophy.  Neurologic: CN 2-12 intact. Symmetrical.  Speech is fluent. Motor exam as listed above. Psychiatric: Judgment intact, Mood & affect appropriate for pt's clinical situation. Dermatologic: No rashes or ulcers noted.  No changes consistent with cellulitis.   CBC Lab Results  Component Value Date   WBC 8.2 07/30/2020   HGB 8.5 (L) 07/30/2020   HCT 25.7 (L) 07/30/2020   MCV 84.5 07/30/2020   PLT 193 07/30/2020  BMET    Component Value Date/Time   NA 141 07/30/2020 0415   K 4.5 07/30/2020 0415   CL 105 07/30/2020 0415   CO2 27 07/30/2020 0415   GLUCOSE 164 (H) 07/30/2020 0415   BUN 30 (H) 07/30/2020 0415   CREATININE 1.30 (H) 07/30/2020 0415   CALCIUM 9.0 07/30/2020 0415   GFRNONAA 40 (L) 07/30/2020 0415   GFRAA 46 (L) 07/30/2020 0415   CrCl cannot be calculated (Patient's most recent lab result is older than the maximum 21 days allowed.).  COAG Lab Results  Component Value Date   INR 1.0 12/18/2019    Radiology MM 3D SCREEN BREAST BILATERAL  Result Date: 09/16/2020 CLINICAL DATA:  Screening. EXAM: DIGITAL SCREENING BILATERAL MAMMOGRAM WITH TOMO AND CAD COMPARISON:  Previous exam(s). ACR Breast Density Category c: The breast tissue is heterogeneously dense, which may obscure small masses. FINDINGS: There are no findings suspicious for malignancy. Images were processed with CAD. IMPRESSION: No mammographic evidence of malignancy. A result letter of this screening mammogram will be mailed directly to the patient. RECOMMENDATION: Screening mammogram in one year. (Code:SM-B-01Y) BI-RADS CATEGORY  1: Negative. Electronically Signed   By: Nolon Nations M.D.   On: 09/16/2020 08:29     Assessment/Plan 1. Other chronic pulmonary embolism without  acute cor pulmonale (HCC) The patient will continue anticoagulation for now as there have not been any problems or complications at this point.  IVC filter should be removed.  IVC filter will be removed in the next week or two. Risk and benefits were reviewed the patient.  Indications for the procedure were reviewed.  All questions were answered, the patient agrees to proceed.   I have had a long discussion with the patient regarding DVT and post phlebitic changes such as swelling and why it  causes symptoms such as pain.  The patient will wear graduated compression stockings class 1 (20-30 mmHg) on a daily basis a prescription was given. The patient will  beginning wearing the stockings first thing in the morning and removing them in the evening. The patient is instructed specifically not to sleep in the stockings.  In addition, behavioral modification including elevation during the day and avoidance of prolonged dependency will be initiated.    The patient will follow-up with me in 3 months after the joint replacement surgery to discuss removal (this was also discussed today and the patient agrees with the plan to have the filter removed).    2. Status post right hip replacement SHe has done well   Plan per Dr Rudene Christians  3. Essential hypertension Continue antihypertensive medications as already ordered, these medications have been reviewed and there are no changes at this time.   4. Type 2 diabetes mellitus with hyperlipidemia ([place]) Continue hypoglycemic medications as already ordered, these medications have been reviewed and there are no changes at this time.  Hgb A1C to be monitored as already arranged by primary service   5. Mixed hyperlipidemia Continue statin as ordered and reviewed, no changes at this time     [name], MD  09/21/2020 11:59 AM

## 2020-09-21 NOTE — Telephone Encounter (Signed)
Spoke with the patient and she is scheduled with Dr. Delana Meyer for a IVC filter removal on 10/06/20 with a 1:00 pm arrival time to the MM. Covid testing on 10/02/20 between 8-1 pm at the [facility]. Pre-procedure instructions were discussed and will be mailed.

## 2020-10-02 ENCOUNTER — Other Ambulatory Visit: Payer: Self-pay

## 2020-10-02 ENCOUNTER — Other Ambulatory Visit
Admission: RE | Admit: 2020-10-02 | Discharge: 2020-10-02 | Disposition: A | Discharge status: Home / Self Care | Payer: Medicare Other | Source: Ambulatory Visit | Attending: Vascular Surgery | Admitting: Vascular Surgery

## 2020-10-02 DIAGNOSIS — Z01812 Encounter for preprocedural laboratory examination: Secondary | ICD-10-CM | POA: Diagnosis present

## 2020-10-02 DIAGNOSIS — Z20822 Contact with and (suspected) exposure to covid-19: Secondary | ICD-10-CM | POA: Diagnosis not present

## 2020-10-02 LAB — SARS CORONAVIRUS 2 (TAT 6-24 HRS): SARS Coronavirus 2: NEGATIVE

## 2020-10-05 ENCOUNTER — Other Ambulatory Visit (INDEPENDENT_AMBULATORY_CARE_PROVIDER_SITE_OTHER): Payer: Self-pay | Admitting: Nurse Practitioner

## 2020-10-06 ENCOUNTER — Encounter: Payer: Self-pay | Admitting: Vascular Surgery

## 2020-10-06 ENCOUNTER — Ambulatory Visit
Admission: RE | Admit: 2020-10-06 | Discharge: 2020-10-06 | Disposition: A | Discharge status: Home / Self Care | Payer: Medicare Other | Attending: Vascular Surgery | Admitting: Vascular Surgery

## 2020-10-06 ENCOUNTER — Encounter
Admission: RE | Disposition: A | Discharge status: Home / Self Care | Payer: Self-pay | Source: Home / Self Care | Attending: Vascular Surgery

## 2020-10-06 ENCOUNTER — Other Ambulatory Visit: Payer: Self-pay

## 2020-10-06 DIAGNOSIS — Z882 Allergy status to sulfonamides status: Secondary | ICD-10-CM | POA: Diagnosis not present

## 2020-10-06 DIAGNOSIS — Z86711 Personal history of pulmonary embolism: Secondary | ICD-10-CM

## 2020-10-06 DIAGNOSIS — Z452 Encounter for adjustment and management of vascular access device: Secondary | ICD-10-CM | POA: Insufficient documentation

## 2020-10-06 DIAGNOSIS — E782 Mixed hyperlipidemia: Secondary | ICD-10-CM | POA: Insufficient documentation

## 2020-10-06 DIAGNOSIS — E119 Type 2 diabetes mellitus without complications: Secondary | ICD-10-CM | POA: Insufficient documentation

## 2020-10-06 DIAGNOSIS — Z888 Allergy status to other drugs, medicaments and biological substances status: Secondary | ICD-10-CM | POA: Diagnosis not present

## 2020-10-06 DIAGNOSIS — I82409 Acute embolism and thrombosis of unspecified deep veins of unspecified lower extremity: Secondary | ICD-10-CM

## 2020-10-06 DIAGNOSIS — Z86718 Personal history of other venous thrombosis and embolism: Secondary | ICD-10-CM

## 2020-10-06 DIAGNOSIS — I2782 Chronic pulmonary embolism: Secondary | ICD-10-CM | POA: Diagnosis not present

## 2020-10-06 DIAGNOSIS — I10 Essential (primary) hypertension: Secondary | ICD-10-CM | POA: Insufficient documentation

## 2020-10-06 HISTORY — PX: IVC FILTER REMOVAL: CATH118246

## 2020-10-06 LAB — GLUCOSE, CAPILLARY: Glucose-Capillary: 72 mg/dL (ref 70–99)

## 2020-10-06 SURGERY — IVC FILTER REMOVAL
Anesthesia: Moderate Sedation

## 2020-10-06 MED ORDER — FENTANYL CITRATE (PF) 100 MCG/2ML IJ SOLN
INTRAMUSCULAR | Status: DC | PRN
Start: 2020-10-06 — End: 2020-10-06
  Administered 2020-10-06: 100 ug via INTRAVENOUS
  Administered 2020-10-06: 50 ug via INTRAVENOUS

## 2020-10-06 MED ORDER — MIDAZOLAM HCL 2 MG/ML PO SYRP: 8.0000 mg | ORAL_SOLUTION | Freq: Once | ORAL | Status: DC | PRN

## 2020-10-06 MED ORDER — METHYLPREDNISOLONE SODIUM SUCC 125 MG IJ SOLR: 125.0000 mg | Freq: Once | INTRAMUSCULAR | Status: DC | PRN

## 2020-10-06 MED ORDER — MIDAZOLAM HCL 2 MG/2ML IJ SOLN
INTRAMUSCULAR | Status: DC | PRN
  Administered 2020-10-06 (×2): 2 mg via INTRAVENOUS

## 2020-10-06 MED ORDER — MIDAZOLAM HCL 2 MG/2ML IJ SOLN
INTRAMUSCULAR | Status: AC
  Filled 2020-10-06: qty 2

## 2020-10-06 MED ORDER — ONDANSETRON HCL 4 MG/2ML IJ SOLN: 4.0000 mg | Freq: Four times a day (QID) | INTRAMUSCULAR | Status: DC | PRN

## 2020-10-06 MED ORDER — HYDROMORPHONE HCL 1 MG/ML IJ SOLN: 1.0000 mg | Freq: Once | INTRAMUSCULAR | Status: DC | PRN

## 2020-10-06 MED ORDER — IODIXANOL 320 MG/ML IV SOLN
INTRAVENOUS | Status: DC | PRN
  Administered 2020-10-06: 15 mL via INTRAVENOUS

## 2020-10-06 MED ORDER — SODIUM CHLORIDE 0.9 % IV SOLN: INTRAVENOUS | Status: DC

## 2020-10-06 MED ORDER — CEFAZOLIN SODIUM-DEXTROSE 2-4 GM/100ML-% IV SOLN
2.0000 g | Freq: Once | INTRAVENOUS | Status: AC
  Administered 2020-10-06: 2 g via INTRAVENOUS

## 2020-10-06 MED ORDER — FENTANYL CITRATE (PF) 100 MCG/2ML IJ SOLN
INTRAMUSCULAR | Status: AC
  Filled 2020-10-06: qty 2

## 2020-10-06 MED ORDER — DIPHENHYDRAMINE HCL 50 MG/ML IJ SOLN: 50.0000 mg | Freq: Once | INTRAMUSCULAR | Status: DC | PRN

## 2020-10-06 MED ORDER — FAMOTIDINE 20 MG PO TABS: 40.0000 mg | ORAL_TABLET | Freq: Once | ORAL | Status: DC | PRN

## 2020-10-06 SURGICAL SUPPLY — 5 items
NEEDLE ENTRY 21GA 7CM ECHOTIP (NEEDLE) ×3 IMPLANT
PACK ANGIOGRAPHY (CUSTOM PROCEDURE TRAY) ×3 IMPLANT
SET INTRO CAPELLA COAXIAL (SET/KITS/TRAYS/PACK) ×3 IMPLANT
SET VENACAVA FILTER RETRIEVAL (MISCELLANEOUS) ×3 IMPLANT
WIRE J 3MM .035X145CM (WIRE) ×3 IMPLANT

## 2020-10-06 NOTE — Op Note (Signed)
  OPERATIVE NOTE   PRE-OPERATIVE DIAGNOSIS: DVT with PE  POST-OPERATIVE DIAGNOSIS: Same  PROCEDURE: 1. Retrieval of IVC Filter 2. Inferior Vena Cavagram  SURGEON: Katha Cabal, M.D.  ANESTHESIA:  Conscious sedation was administered under my direct supervision by the interventional radiology RN. IV Versed plus fentanyl were utilized. Continuous ECG, pulse oximetry and blood pressure was monitored throughout the entire procedure. Conscious sedation was for a total of 36 minutes.  ESTIMATED BLOOD LOSS: Minimal cc  FINDING(S):inferior vena cava is widely patent filter is in place in good position. Filter is removed without incident  SPECIMEN(S):  IVC filter intact  INDICATIONS:   Crystal Terrell is a 75 y.o. female who presents with DVT. The patient has now tolerated anticoagulation for several months. Therefore, the IVC filter is recommended to be removed. The risks and benefits were reviewed with the patient all questions were answered and they agreed to proceed with IVC filter retrieval. Oral anticoagulation will be continued.  DESCRIPTION: After obtaining full informed written consent, the patient was brought back to the Special Procedure Suite and placed in the supine position.  The patient received IV antibiotics prior to induction.  After obtaining adequate sedation, the patient was prepped and draped in the standard fashion and appropriate time out is called.     Ultrasound was placed in a sterile sleeve.The right neck was then imaged with ultrasound.   Jugular vein was identified it is echolucent and homogeneous indicating patency. 1% lidocaine is then infiltrated under ultrasound visualization and subsequently a Seldinger needle is inserted under real-time ultrasound guidance.  J-wire is then advanced into the inferior vena cava under fluoroscopic guidance. With the tip of the sheath at the confluence of the iliac veins inferior vena caval imaging is performed.  After  review of the image the sheath is repositioned to above the filter and the snares introduced. Snares opened and the hook is secured without difficulty. The filter is then collapsed within the sheath and removed without difficulty.  Sheath is removed by pressures held the patient tolerated the procedure well and there were no immediate complications.  Interpretation: inferior vena cava is widely patent filter is in place in good position. Filter is removed without incident.     COMPLICATIONS: None  CONDITION: [name], M.D. [facility] Office: [phone]   10/06/2020, 2:48 PM

## 2020-10-06 NOTE — Discharge Instructions (Signed)
Moderate Conscious Sedation, Adult, Care After These instructions provide you with information about caring for yourself after your procedure. Your health care provider may also give you more specific instructions. Your treatment has been planned according to current medical practices, but problems sometimes occur. Call your health care provider if you have any problems or questions after your procedure. What can I expect after the procedure? After your procedure, it is common:  To feel sleepy for several hours.  To feel clumsy and have poor balance for several hours.  To have poor judgment for several hours.  To vomit if you eat too soon. Follow these instructions at home: For at least 24 hours after the procedure:   Do not: ? Participate in activities where you could fall or become injured. ? Drive. ? Use heavy machinery. ? Drink alcohol. ? Take sleeping pills or medicines that cause drowsiness. ? Make important decisions or sign legal documents. ? Take care of children on your own.  Rest. Eating and drinking  Follow the diet recommended by your health care provider.  If you vomit: ? Drink water, juice, or soup when you can drink without vomiting. ? Make sure you have little or no nausea before eating solid foods. General instructions  Have a responsible adult stay with you until you are awake and alert.  Take over-the-counter and prescription medicines only as told by your health care provider.  If you smoke, do not smoke without supervision.  Keep all follow-up visits as told by your health care provider. This is important. Contact a health care provider if:  You keep feeling nauseous or you keep vomiting.  You feel light-headed.  You develop a rash.  You have a fever. Get help right away if:  You have trouble breathing. This information is not intended to replace advice given to you by your health care provider. Make sure you discuss any questions you have  with your health care provider. Document Revised: 11/17/2017 Document Reviewed: 03/26/2016 Elsevier Patient Education  [place]. Inferior Vena Cava Filter Removal  Inferior vena cava filter removal is a procedure to take out a metal filter that was placed into a large vein in the abdomen (inferior vena cava, IVC). An IVC filter prevents blood clots in the legs or pelvis from traveling to the heart or lungs. Some IVC filters are designed to be removed (retrievable filters). You may have your filter removed when the danger of forming blood clots has passed or when you can take blood-thinning medicine to prevent blood clots. In some cases, the filter may need to be removed because it becomes damaged, is not working, or is causing problems. Most filters can be removed through the vein (percutaneous). In the rare cases when a surgeon is unable to remove the filter percutaneously, one of these steps may be taken:  A more invasive, open surgery may be necessary.  The filter may be left in place. Tell a health care provider about:  Any allergies you have.  All medicines you are taking, including vitamins, herbs, eye drops, creams, and over-the-counter medicines.  Any problems you or family members have had with anesthetic medicines or with contrast dyes that are used during an imaging test.  Any blood disorders you have.  Any surgeries you have had.  Any medical conditions you have.  Whether you are pregnant or may be pregnant. What are the risks? Generally, this is a safe procedure. However, problems may occur, including:  Infection.  Bleeding.  Allergic reactions to medicines or dyes.  Damage to the IVC, other blood vessels, or surrounding structures.  A blood clot or a piece of the filter breaking loose and traveling to the heart or lungs. What happens before the procedure? Medicines Ask your health care provider about:  Changing or stopping your regular medicines.  This is especially important if you are taking diabetes medicines or blood thinners.  Taking medicines such as aspirin and ibuprofen. These medicines can thin your blood. Do not take these medicines unless your health care provider tells you to take them.  Taking over-the-counter medicines, vitamins, herbs, and supplements. Staying hydrated Follow instructions from your health care provider about hydration, which may include:  Up to 2 hours before the procedure - you may continue to drink clear liquids, such as water or clear fruit juice. Eating and drinking restrictions Follow instructions from your health care provider about eating and drinking, which may include:  8 hours before the procedure - stop eating heavy meals or foods such as meat, fried foods, or fatty foods.  6 hours before the procedure - stop eating light meals or foods, such as toast or cereal.  6 hours before the procedure - stop drinking milk or drinks that contain milk.  2 hours before the procedure - stop drinking clear liquids. General instructions  Plan to have someone take you home from the hospital or clinic.  Plan to have a responsible adult care for you for at least 24 hours after you leave the hospital or clinic. This is important. What happens during the procedure?  To lower your risk of infection: ? Your health care team will wash or sanitize their hands. ? Hair may be removed from the surgical area. ? Your skin will be washed with soap.  An IV will be inserted into one of your veins.  You will be given one or more of the following: ? A medicine to help you relax (sedative). ? A medicine to make you fall asleep (general anesthetic).  The procedure will be done through a vein in your groin or neck that leads to the IVC. Your health care provider will inject a numbing medicine (local anesthetic) into the skin over the vein that will be used.  A small incision will be made over the vein.  A long,  thin tube (catheter) will be inserted into the vein.  The catheter will be moved through your vein and into your IVC. X-rays may be done to help guide the catheter into place. Dye may be injected through the catheter before the X-rays to make the catheter and filter easier to see.  When the catheter reaches the filter, a hook (snare) on the end of the catheter may be used to latch onto the filter. In some cases, a grasping instrument (forceps) may be threaded through the catheter to gently grab and remove the filter instead.  After the filter has been hooked or grasped, the filter and instruments will be pulled out through the catheter.  The catheter will be removed through the incision in your skin.  Pressure will be placed over your incision until bleeding stops.  A bandage (dressing) will be placed over your incision. The procedure may vary among health care providers and hospitals. What happens after the procedure?  Your blood pressure, heart rate, breathing rate, and blood oxygen level will be monitored until the medicines you were given have worn off.  Do not drive for 24 hours if you were given  a sedative during your procedure.  You may need to stay in bed (be on bed rest) for a period of time. Summary  Inferior vena cava (IVC) filter removal is a procedure to take out a filter that was placed to prevent blood clots from traveling to your heart or lungs.  You may have your filter removed when the danger of forming blood clots has passed or when you can take blood-thinning medicines to prevent blood clots. In some cases, a filter is removed because there is a problem with it.  The removal procedure is similar to the procedure that was used to insert the filter. A long, thin tube (catheter) will be inserted through a vein in your groin or neck. Then, the filter will be gently grasped and pulled out through the catheter.  Plan to have a responsible adult care for you for at least 24  hours after you leave the hospital or clinic. This is important. This information is not intended to replace advice given to you by your health care provider. Make sure you discuss any questions you have with your health care provider. Document Revised: 11/17/2017 Document Reviewed: 06/20/2017 Elsevier Patient Education  [place].

## 2020-10-07 ENCOUNTER — Encounter: Payer: Self-pay | Admitting: Vascular Surgery

## 2020-10-12 NOTE — Interval H&P Note (Signed)
History and Physical Interval Note:  10/12/2020 1:10 PM  Crystal Terrell  has presented today for surgery, with the diagnosis of IVC filter removal   DVTCovid  Oct 15.  The various methods of treatment have been discussed with the patient and family. After consideration of risks, benefits and other options for treatment, the patient has consented to  Procedure(s): IVC FILTER REMOVAL (N/A) as a surgical intervention.  The patient's history has been reviewed, patient examined, no change in status, stable for surgery.  I have reviewed the patient's chart and labs.  Questions were answered to the patient's satisfaction.     [name]

## 2020-10-29 ENCOUNTER — Encounter (INDEPENDENT_AMBULATORY_CARE_PROVIDER_SITE_OTHER): Payer: Self-pay | Admitting: Vascular Surgery

## 2020-10-29 ENCOUNTER — Other Ambulatory Visit: Payer: Self-pay

## 2020-10-29 ENCOUNTER — Ambulatory Visit (INDEPENDENT_AMBULATORY_CARE_PROVIDER_SITE_OTHER): Payer: Medicare Other | Admitting: Vascular Surgery

## 2020-10-29 VITALS — BP 125/70 | HR 57 | Ht 62.0 in | Wt 207.0 lb

## 2020-10-29 DIAGNOSIS — I10 Essential (primary) hypertension: Secondary | ICD-10-CM

## 2020-10-29 DIAGNOSIS — Z86711 Personal history of pulmonary embolism: Secondary | ICD-10-CM

## 2020-10-29 DIAGNOSIS — Z86718 Personal history of other venous thrombosis and embolism: Secondary | ICD-10-CM

## 2020-10-29 DIAGNOSIS — E1169 Type 2 diabetes mellitus with other specified complication: Secondary | ICD-10-CM

## 2020-10-29 DIAGNOSIS — E785 Hyperlipidemia, unspecified: Secondary | ICD-10-CM

## 2020-10-29 DIAGNOSIS — E782 Mixed hyperlipidemia: Secondary | ICD-10-CM

## 2020-10-30 NOTE — Progress Notes (Signed)
MRN : [id number]  Crystal Terrell is a 74 y.o. (02/16/1946) female who presents with chief complaint of  Chief Complaint  Patient presents with   Follow-up    3 wk [facility]   post IVC filter removal no stuidies  .  History of Present Illness:    The patient presents to the office for follow up after IVC filter removal secondary to history of DVT.    The patient denies her leg is painful with dependency and states it doesn't swell much.  Symptoms are better with elevation.  The patient notes minimal edema in the morning which steadily worsens throughout the day.    The patient has been using compression therapy at this point.  No SOB or pleuritic chest pains.  No cough or hemoptysis.  No blood per rectum or blood in any sputum.  No excessive bruising per the patient.    Current Meds  Medication Sig   acetaminophen (TYLENOL) 500 MG tablet Take 1-2 tablets (500-1,000 mg total) by mouth every 6 (six) hours as needed for mild pain (pain score 1-3 or temp > 100.5).   allopurinol (ZYLOPRIM) 100 MG tablet Take 100 mg by mouth 2 (two) times daily.    amLODipine (NORVASC) 10 MG tablet Take 10 mg by mouth every evening.    apixaban (ELIQUIS) 5 MG TABS tablet Take 2 tablets (10 mg total) by mouth 2 (two) times daily. And from 12/27/19 take 5 mg (1 tab) two times a day   B Complex-C (B-COMPLEX WITH VITAMIN C) tablet Take 1 tablet by mouth daily.   docusate sodium (COLACE) 100 MG capsule Take 1 capsule (100 mg total) by mouth 2 (two) times daily.   Dulaglutide 1.5 MG/0.5ML SOPN Inject 1.5 mg into the skin every Sunday at 6pm.    fenofibrate 160 MG tablet Take 160 mg by mouth daily.   ferrous [id number] C-folic acid (TRINSICON / FOLTRIN) capsule Take 1 capsule by mouth 2 (two) times daily. (Patient taking differently: Take 1 capsule by mouth daily. )   furosemide (LASIX) 40 MG tablet Take 40 mg by mouth at bedtime.    glimepiride (AMARYL) 4 MG tablet Take 1 tablet (4 mg total)  by mouth daily with breakfast.   losartan (COZAAR) 100 MG tablet Take 100 mg by mouth at bedtime.    lovastatin (MEVACOR) 20 MG tablet Take 20 mg by mouth at bedtime.    metoprolol succinate (TOPROL-XL) 100 MG 24 hr tablet Take 100 mg by mouth at bedtime. Take with or immediately following a meal.    Omega-3 1000 MG CAPS Take 1,000 mg by mouth 2 (two) times daily.   traMADol (ULTRAM) 50 MG tablet Take 1 tablet (50 mg total) by mouth every 6 (six) hours as needed. (Patient taking differently: Take 50 mg by mouth every 6 (six) hours as needed (pain.). )    Past Medical History:  Diagnosis Date   Adenoma of rectum    Anemia    Diabetes mellitus without complication ([place])    Diverticulosis    Ganglion cyst    right   Gout    Hypertension    Mixed hyperlipidemia    Morbid obesity with BMI of 40.0-44.9, adult (HCC)    Venous insufficiency of lower extremity     Past Surgical History:  Procedure Laterality Date   ABDOMINAL HYSTERECTOMY     COLONOSCOPY     COLONOSCOPY WITH PROPOFOL N/A 11/07/2019   Procedure: COLONOSCOPY WITH PROPOFOL;  Surgeon: Huttig, Euclid  K, MD;  Location: [facility] ENDOSCOPY;  Service: Gastroenterology;  Laterality: N/A;   HYSTEROSCOPY WITH D & C     IVC FILTER INSERTION N/A 07/21/2020   Procedure: IVC FILTER INSERTION;  Surgeon: Katha Cabal, MD;  Location: [place] [facility];  Service: Cardiovascular;  Laterality: N/A;   IVC FILTER REMOVAL N/A 10/06/2020   Procedure: IVC FILTER REMOVAL;  Surgeon: Katha Cabal, MD;  Location: [place] [facility];  Service: Cardiovascular;  Laterality: N/A;   TOTAL HIP ARTHROPLASTY Right 07/28/2020   Procedure: TOTAL HIP ARTHROPLASTY ANTERIOR APPROACH;  Surgeon: Hessie Knows, MD;  Location: [facility] ORS;  Service: Orthopedics;  Laterality: Right;   TUBAL LIGATION      Social History Social History   Tobacco Use   Smoking status: Never Smoker   Smokeless tobacco: Never Used  Brewing technologist Use: Never used  Substance Use Topics   Alcohol use: Not Currently   Drug use: Never    Family History Family History  Problem Relation Age of Onset   Breast cancer Neg Hx     Allergies  Allergen Reactions   Metformin     Makes her feel sick   Sulfa Antibiotics Nausea And Vomiting     REVIEW OF SYSTEMS (Negative unless checked)  Constitutional: [] Weight loss  [] Fever  [] Chills Cardiac: [] Chest pain   [] Chest pressure   [] Palpitations   [] Shortness of breath when laying flat   [] Shortness of breath with exertion. Vascular:  [] Pain in legs with walking   [] Pain in legs at rest  [x] History of DVT   [] Phlebitis   [x] Swelling in legs   [] Varicose veins   [] Non-healing ulcers Pulmonary:   [] Uses home oxygen   [] Productive cough   [] Hemoptysis   [] Wheeze  [] COPD   [] Asthma Neurologic:  [] Dizziness   [] Seizures   [] History of stroke   [] History of TIA  [] Aphasia   [] Vissual changes   [] Weakness or numbness in arm   [] Weakness or numbness in leg Musculoskeletal:   [] Joint swelling   [] Joint pain   [] Low back pain Hematologic:  [] Easy bruising  [] Easy bleeding   [] Hypercoagulable state   [] Anemic Gastrointestinal:  [] Diarrhea   [] Vomiting  [] Gastroesophageal reflux/heartburn   [] Difficulty swallowing. Genitourinary:  [] Chronic kidney disease   [] Difficult urination  [] Frequent urination   [] Blood in urine Skin:  [] Rashes   [] Ulcers  Psychological:  [] History of anxiety   []  History of major depression.  Physical Examination  Vitals:   10/29/20 1437  BP: 125/70  Pulse: (!) 57  Weight: 207 lb (93.9 kg)  Height: 5\' 2"  (1.575 m)   Body mass index is 37.86 kg/m. Gen: WD/WN, NAD Head: [place]/AT, No temporalis wasting.  Ear/Nose/Throat: Hearing grossly intact, nares w/o erythema or drainage Eyes: PER, EOMI, sclera nonicteric.  Neck: Supple, no large masses.   Pulmonary:  Good air movement, no audible wheezing bilaterally, no use of accessory muscles.  Cardiac: RRR, no  JVD Vascular: scattered varicosities present bilaterally.  Mild venous stasis changes to the legs bilaterally.  Mild soft pitting edema. Vessel Right Left  Radial Palpable Palpable  Gastrointestinal: Non-distended. No guarding/no peritoneal signs.  Musculoskeletal: M/S 5/5 throughout.  No deformity or atrophy.  Neurologic: CN 2-12 intact. Symmetrical.  Speech is fluent. Motor exam as listed above. Psychiatric: Judgment intact, Mood & affect appropriate for pt's clinical situation. Dermatologic: Mild venous rashes no ulcers noted.  No changes consistent with cellulitis. Lymph : No lichenification or skin changes of  chronic lymphedema.  CBC Lab Results  Component Value Date   WBC 8.2 07/30/2020   HGB 8.5 (L) 07/30/2020   HCT 25.7 (L) 07/30/2020   MCV 84.5 07/30/2020   PLT 193 07/30/2020    BMET    Component Value Date/Time   NA 141 07/30/2020 0415   K 4.5 07/30/2020 0415   CL 105 07/30/2020 0415   CO2 27 07/30/2020 0415   GLUCOSE 164 (H) 07/30/2020 0415   BUN 30 (H) 07/30/2020 0415   CREATININE 1.30 (H) 07/30/2020 0415   CALCIUM 9.0 07/30/2020 0415   GFRNONAA 40 (L) 07/30/2020 0415   GFRAA 46 (L) 07/30/2020 0415   CrCl cannot be calculated (Patient's most recent lab result is older than the maximum 21 days allowed.).  COAG Lab Results  Component Value Date   INR 1.0 12/18/2019    Radiology PERIPHERAL VASCULAR CATHETERIZATION  Result Date: 10/06/2020 See Op note    Assessment/Plan 1. History of pulmonary embolus (PE) Recommend:   No surgery or intervention at this point in time.  IVC filter was successfully removed and at the present time IVC filter is not indicated.  The patient is initiated on anticoagulation   Elevation was stressed, use of a recliner was discussed.  I have had a long discussion with the patient regarding DVT and post phlebitic changes such as swelling and why it  causes symptoms such as pain.  The patient will wear graduated compression  stockings class 1 (20-30 mmHg), beginning after three full days of anticoagulation, on a daily basis a prescription was given. The patient will  beginning wearing the stockings first thing in the morning and removing them in the evening. The patient is instructed specifically not to sleep in the stockings.  In addition, behavioral modification including elevation during the day and avoidance of prolonged dependency will be initiated.    The patient will continue anticoagulation for now as there have not been any problems or complications at this point.    2. History of DVT (deep vein thrombosis) See #1  3. Essential hypertension Continue antihypertensive medications as already ordered, these medications have been reviewed and there are no changes at this time.   4. Type 2 diabetes mellitus with hyperlipidemia ([place]) Continue hypoglycemic medications as already ordered, these medications have been reviewed and there are no changes at this time.  Hgb A1C to be monitored as already arranged by primary service   5. Mixed hyperlipidemia Continue statin as ordered and reviewed, no changes at this time    [name], MD  10/30/2020 5:55 PM

## 2020-10-31 ENCOUNTER — Encounter (INDEPENDENT_AMBULATORY_CARE_PROVIDER_SITE_OTHER): Payer: Self-pay | Admitting: Vascular Surgery

## 2021-05-13 DIAGNOSIS — J069 Acute upper respiratory infection, unspecified: Secondary | ICD-10-CM | POA: Diagnosis not present

## 2021-05-13 DIAGNOSIS — Z20822 Contact with and (suspected) exposure to covid-19: Secondary | ICD-10-CM | POA: Diagnosis not present

## 2021-05-18 DIAGNOSIS — E1169 Type 2 diabetes mellitus with other specified complication: Secondary | ICD-10-CM | POA: Diagnosis not present

## 2021-05-18 DIAGNOSIS — D638 Anemia in other chronic diseases classified elsewhere: Secondary | ICD-10-CM | POA: Diagnosis not present

## 2021-05-18 DIAGNOSIS — N1831 Chronic kidney disease, stage 3a: Secondary | ICD-10-CM | POA: Diagnosis not present

## 2021-05-18 DIAGNOSIS — I10 Essential (primary) hypertension: Secondary | ICD-10-CM | POA: Diagnosis not present

## 2021-05-18 DIAGNOSIS — E785 Hyperlipidemia, unspecified: Secondary | ICD-10-CM | POA: Diagnosis not present

## 2021-05-25 DIAGNOSIS — E669 Obesity, unspecified: Secondary | ICD-10-CM | POA: Diagnosis not present

## 2021-05-25 DIAGNOSIS — E119 Type 2 diabetes mellitus without complications: Secondary | ICD-10-CM | POA: Diagnosis not present

## 2021-05-25 DIAGNOSIS — E785 Hyperlipidemia, unspecified: Secondary | ICD-10-CM | POA: Diagnosis not present

## 2021-05-25 DIAGNOSIS — Z0000 Encounter for general adult medical examination without abnormal findings: Secondary | ICD-10-CM | POA: Diagnosis not present

## 2021-05-25 DIAGNOSIS — I10 Essential (primary) hypertension: Secondary | ICD-10-CM | POA: Diagnosis not present

## 2021-05-25 DIAGNOSIS — D638 Anemia in other chronic diseases classified elsewhere: Secondary | ICD-10-CM | POA: Diagnosis not present

## 2021-06-02 DIAGNOSIS — Z96641 Presence of right artificial hip joint: Secondary | ICD-10-CM | POA: Diagnosis not present

## 2021-06-02 DIAGNOSIS — G8918 Other acute postprocedural pain: Secondary | ICD-10-CM | POA: Diagnosis not present

## 2021-06-02 DIAGNOSIS — R1031 Right lower quadrant pain: Secondary | ICD-10-CM | POA: Diagnosis not present

## 2021-06-02 DIAGNOSIS — M898X9 Other specified disorders of bone, unspecified site: Secondary | ICD-10-CM | POA: Diagnosis not present

## 2021-07-05 DIAGNOSIS — M1611 Unilateral primary osteoarthritis, right hip: Secondary | ICD-10-CM | POA: Diagnosis not present

## 2021-07-05 DIAGNOSIS — Z96641 Presence of right artificial hip joint: Secondary | ICD-10-CM | POA: Diagnosis not present

## 2021-07-05 DIAGNOSIS — M4807 Spinal stenosis, lumbosacral region: Secondary | ICD-10-CM | POA: Diagnosis not present

## 2021-07-05 DIAGNOSIS — M9933 Osseous stenosis of neural canal of lumbar region: Secondary | ICD-10-CM | POA: Diagnosis not present

## 2021-07-09 ENCOUNTER — Other Ambulatory Visit: Payer: Self-pay | Admitting: Orthopedic Surgery

## 2021-07-09 ENCOUNTER — Other Ambulatory Visit (HOSPITAL_COMMUNITY): Payer: Self-pay | Admitting: Orthopedic Surgery

## 2021-07-09 DIAGNOSIS — M4807 Spinal stenosis, lumbosacral region: Secondary | ICD-10-CM

## 2021-07-14 ENCOUNTER — Other Ambulatory Visit: Payer: Self-pay

## 2021-07-14 ENCOUNTER — Ambulatory Visit
Admission: RE | Admit: 2021-07-14 | Discharge: 2021-07-14 | Disposition: A | Discharge status: Home / Self Care | Payer: Medicare HMO | Source: Ambulatory Visit | Attending: Orthopedic Surgery | Admitting: Orthopedic Surgery

## 2021-07-14 DIAGNOSIS — M4807 Spinal stenosis, lumbosacral region: Secondary | ICD-10-CM | POA: Diagnosis not present

## 2021-07-14 DIAGNOSIS — M545 Low back pain, unspecified: Secondary | ICD-10-CM | POA: Diagnosis not present

## 2021-07-26 DIAGNOSIS — R200 Anesthesia of skin: Secondary | ICD-10-CM | POA: Diagnosis not present

## 2021-08-31 ENCOUNTER — Other Ambulatory Visit: Payer: Self-pay | Admitting: Family Medicine

## 2021-08-31 DIAGNOSIS — Z1231 Encounter for screening mammogram for malignant neoplasm of breast: Secondary | ICD-10-CM

## 2021-09-15 DIAGNOSIS — E119 Type 2 diabetes mellitus without complications: Secondary | ICD-10-CM | POA: Diagnosis not present

## 2021-09-15 DIAGNOSIS — H40013 Open angle with borderline findings, low risk, bilateral: Secondary | ICD-10-CM | POA: Diagnosis not present

## 2021-09-16 ENCOUNTER — Ambulatory Visit
Admission: RE | Admit: 2021-09-16 | Discharge: 2021-09-16 | Disposition: A | Discharge status: Home / Self Care | Payer: Medicare HMO | Source: Ambulatory Visit | Attending: Family Medicine | Admitting: Family Medicine

## 2021-09-16 ENCOUNTER — Other Ambulatory Visit: Payer: Self-pay

## 2021-09-16 DIAGNOSIS — Z1231 Encounter for screening mammogram for malignant neoplasm of breast: Secondary | ICD-10-CM | POA: Diagnosis not present

## 2021-09-21 DIAGNOSIS — G608 Other hereditary and idiopathic neuropathies: Secondary | ICD-10-CM | POA: Diagnosis not present

## 2021-11-17 DIAGNOSIS — E1169 Type 2 diabetes mellitus with other specified complication: Secondary | ICD-10-CM | POA: Diagnosis not present

## 2021-11-17 DIAGNOSIS — D638 Anemia in other chronic diseases classified elsewhere: Secondary | ICD-10-CM | POA: Diagnosis not present

## 2021-11-17 DIAGNOSIS — E782 Mixed hyperlipidemia: Secondary | ICD-10-CM | POA: Diagnosis not present

## 2021-11-24 DIAGNOSIS — E1122 Type 2 diabetes mellitus with diabetic chronic kidney disease: Secondary | ICD-10-CM | POA: Diagnosis not present

## 2021-11-24 DIAGNOSIS — I129 Hypertensive chronic kidney disease with stage 1 through stage 4 chronic kidney disease, or unspecified chronic kidney disease: Secondary | ICD-10-CM | POA: Diagnosis not present

## 2021-11-24 DIAGNOSIS — I82452 Acute embolism and thrombosis of left peroneal vein: Secondary | ICD-10-CM | POA: Diagnosis not present

## 2021-11-24 DIAGNOSIS — I2782 Chronic pulmonary embolism: Secondary | ICD-10-CM | POA: Diagnosis not present

## 2021-11-24 DIAGNOSIS — E782 Mixed hyperlipidemia: Secondary | ICD-10-CM | POA: Diagnosis not present

## 2021-11-24 DIAGNOSIS — D631 Anemia in chronic kidney disease: Secondary | ICD-10-CM | POA: Diagnosis not present

## 2021-11-24 DIAGNOSIS — N1831 Chronic kidney disease, stage 3a: Secondary | ICD-10-CM | POA: Diagnosis not present

## 2021-11-24 DIAGNOSIS — E1169 Type 2 diabetes mellitus with other specified complication: Secondary | ICD-10-CM | POA: Diagnosis not present

## 2022-05-16 DIAGNOSIS — J029 Acute pharyngitis, unspecified: Secondary | ICD-10-CM | POA: Diagnosis not present

## 2022-05-16 DIAGNOSIS — J101 Influenza due to other identified influenza virus with other respiratory manifestations: Secondary | ICD-10-CM | POA: Diagnosis not present

## 2022-05-16 DIAGNOSIS — Z03818 Encounter for observation for suspected exposure to other biological agents ruled out: Secondary | ICD-10-CM | POA: Diagnosis not present

## 2022-05-23 DIAGNOSIS — E785 Hyperlipidemia, unspecified: Secondary | ICD-10-CM | POA: Diagnosis not present

## 2022-05-23 DIAGNOSIS — E782 Mixed hyperlipidemia: Secondary | ICD-10-CM | POA: Diagnosis not present

## 2022-05-23 DIAGNOSIS — D638 Anemia in other chronic diseases classified elsewhere: Secondary | ICD-10-CM | POA: Diagnosis not present

## 2022-05-23 DIAGNOSIS — N1831 Chronic kidney disease, stage 3a: Secondary | ICD-10-CM | POA: Diagnosis not present

## 2022-05-23 DIAGNOSIS — I10 Essential (primary) hypertension: Secondary | ICD-10-CM | POA: Diagnosis not present

## 2022-05-23 DIAGNOSIS — E1169 Type 2 diabetes mellitus with other specified complication: Secondary | ICD-10-CM | POA: Diagnosis not present

## 2022-05-30 DIAGNOSIS — E1122 Type 2 diabetes mellitus with diabetic chronic kidney disease: Secondary | ICD-10-CM | POA: Diagnosis not present

## 2022-05-30 DIAGNOSIS — E119 Type 2 diabetes mellitus without complications: Secondary | ICD-10-CM | POA: Diagnosis not present

## 2022-05-30 DIAGNOSIS — Z1389 Encounter for screening for other disorder: Secondary | ICD-10-CM | POA: Diagnosis not present

## 2022-05-30 DIAGNOSIS — H40013 Open angle with borderline findings, low risk, bilateral: Secondary | ICD-10-CM | POA: Diagnosis not present

## 2022-05-30 DIAGNOSIS — R011 Cardiac murmur, unspecified: Secondary | ICD-10-CM | POA: Diagnosis not present

## 2022-05-30 DIAGNOSIS — D631 Anemia in chronic kidney disease: Secondary | ICD-10-CM | POA: Diagnosis not present

## 2022-05-30 DIAGNOSIS — E669 Obesity, unspecified: Secondary | ICD-10-CM | POA: Diagnosis not present

## 2022-05-30 DIAGNOSIS — Z0000 Encounter for general adult medical examination without abnormal findings: Secondary | ICD-10-CM | POA: Diagnosis not present

## 2022-05-30 DIAGNOSIS — Z86718 Personal history of other venous thrombosis and embolism: Secondary | ICD-10-CM | POA: Diagnosis not present

## 2022-05-30 DIAGNOSIS — Z86711 Personal history of pulmonary embolism: Secondary | ICD-10-CM | POA: Diagnosis not present

## 2022-05-30 DIAGNOSIS — N189 Chronic kidney disease, unspecified: Secondary | ICD-10-CM | POA: Diagnosis not present

## 2022-09-12 ENCOUNTER — Other Ambulatory Visit: Payer: Self-pay | Admitting: Family Medicine

## 2022-09-12 DIAGNOSIS — Z1231 Encounter for screening mammogram for malignant neoplasm of breast: Secondary | ICD-10-CM

## 2022-10-03 ENCOUNTER — Ambulatory Visit
Admission: RE | Admit: 2022-10-03 | Discharge: 2022-10-03 | Disposition: A | Discharge status: Home / Self Care | Payer: Medicare HMO | Source: Ambulatory Visit | Attending: Family Medicine | Admitting: Family Medicine

## 2022-10-03 DIAGNOSIS — Z1231 Encounter for screening mammogram for malignant neoplasm of breast: Secondary | ICD-10-CM | POA: Diagnosis not present

## 2022-10-07 ENCOUNTER — Other Ambulatory Visit: Payer: Self-pay | Admitting: Family Medicine

## 2022-10-07 DIAGNOSIS — R928 Other abnormal and inconclusive findings on diagnostic imaging of breast: Secondary | ICD-10-CM

## 2022-10-07 DIAGNOSIS — N63 Unspecified lump in unspecified breast: Secondary | ICD-10-CM

## 2022-10-18 ENCOUNTER — Ambulatory Visit
Admission: RE | Admit: 2022-10-18 | Discharge: 2022-10-18 | Disposition: A | Discharge status: Home / Self Care | Payer: Medicare HMO | Source: Ambulatory Visit | Attending: Family Medicine | Admitting: Family Medicine

## 2022-10-18 DIAGNOSIS — R922 Inconclusive mammogram: Secondary | ICD-10-CM | POA: Diagnosis not present

## 2022-10-18 DIAGNOSIS — R928 Other abnormal and inconclusive findings on diagnostic imaging of breast: Secondary | ICD-10-CM

## 2022-10-18 DIAGNOSIS — N63 Unspecified lump in unspecified breast: Secondary | ICD-10-CM | POA: Diagnosis not present

## 2022-10-19 IMAGING — MR MR LUMBAR SPINE W/O CM
4 of 5 series · 25 of 48 positions shown · non-contrast
Comparison: Chest CTA 12/18/2019.

CLINICAL DATA: 75-year-old female with persistent low back pain.
Right groin pain when walking.

EXAM:
MRI LUMBAR SPINE WITHOUT CONTRAST
TECHNIQUE: Multiplanar, multisequence MR imaging of the lumbar spine was
performed. No intravenous contrast was administered.

[Series 3: T2 · sagittal · 4.0mm · 0.81mm/px · 6 of 15 slices shown (1 of 2)]
[im 1/15]
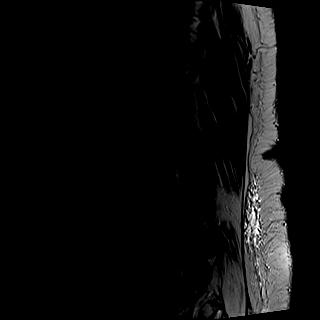
[im 3/15]
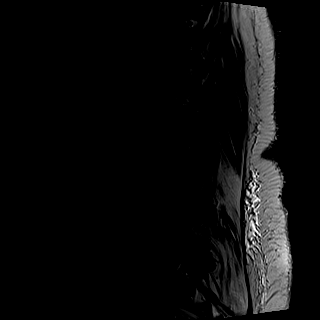
[im 6/15]
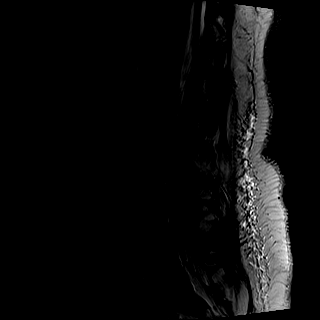
[im 9/15]
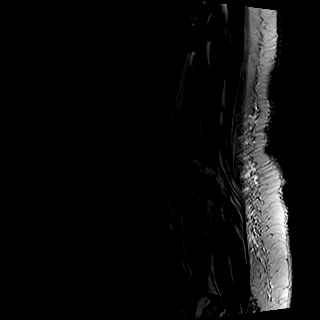
[im 12/15]
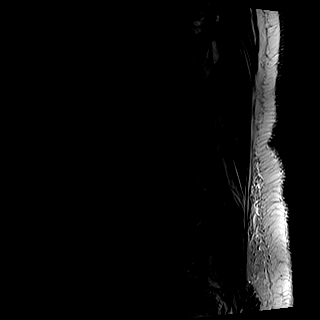
[im 15/15]
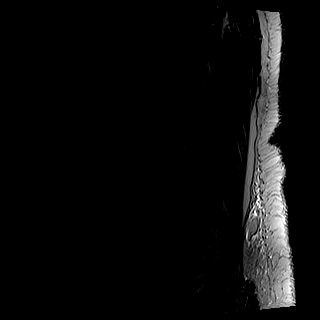

[Series 4: T1 · sagittal · 4.0mm · 0.41mm/px · 6 of 15 slices shown (1 of 2)]
[im 1/15]
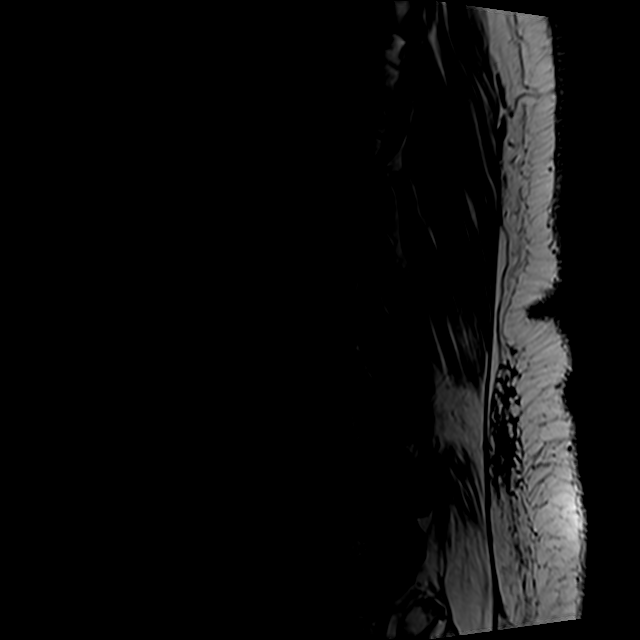
[im 3/15]
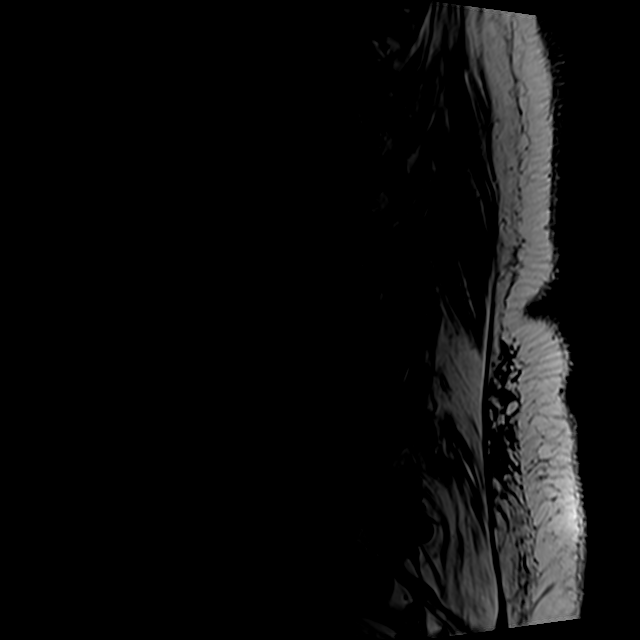
[im 6/15]
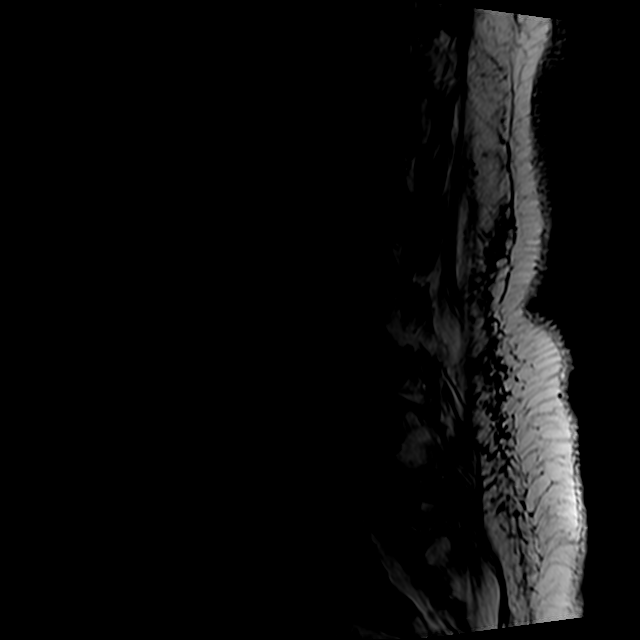
[im 9/15]
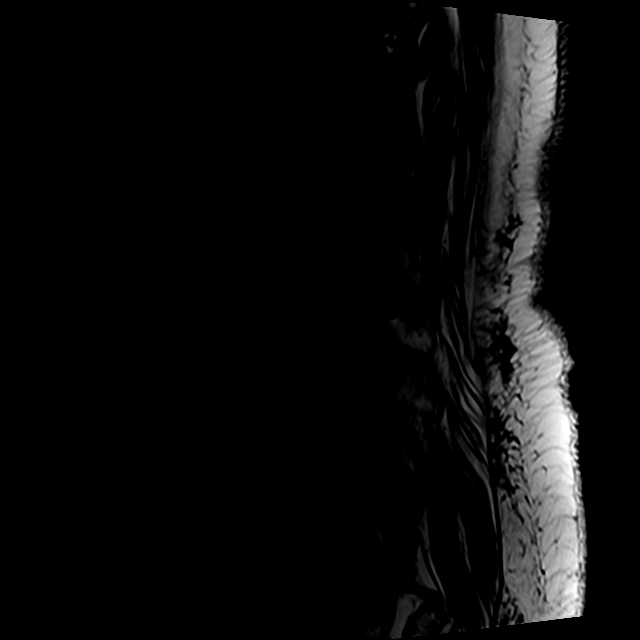
[im 12/15]
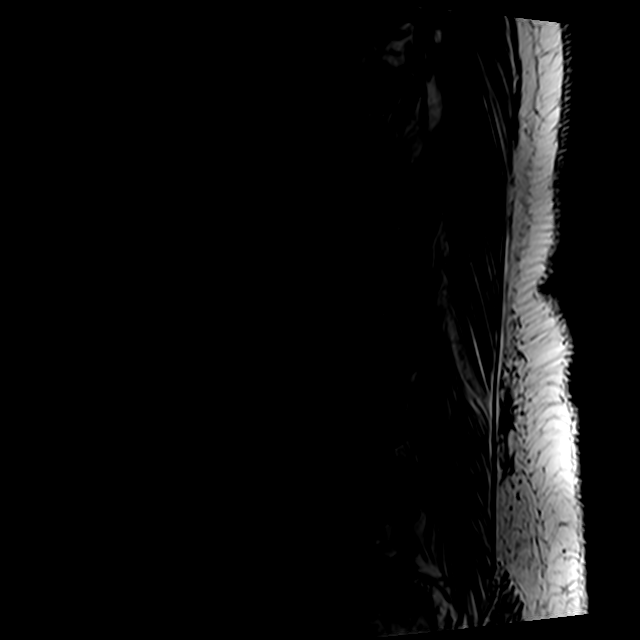
[im 15/15]
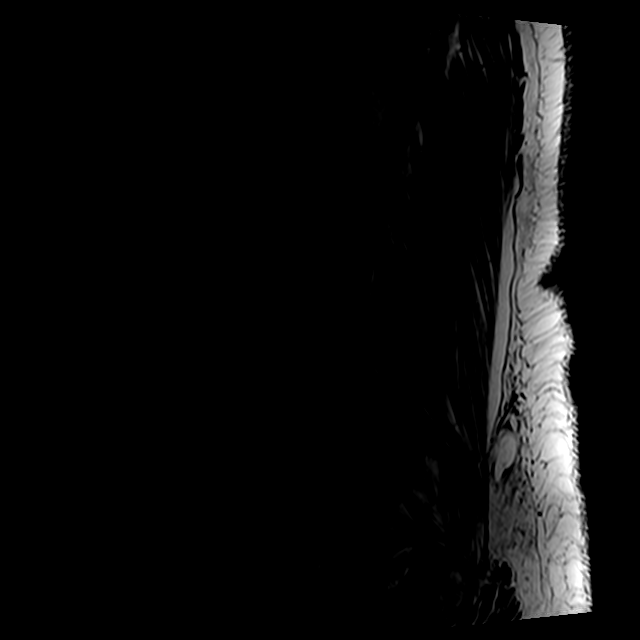

[Series 6: T2 · axial · 4.0mm · 0.78mm/px · z∈[-89,+138]mm · 9 of 41 slices shown (2 of 2)]
[im 1/41]
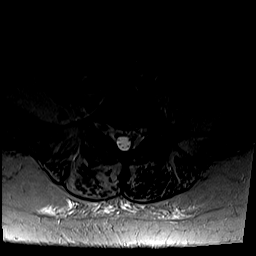
[im 6/41]
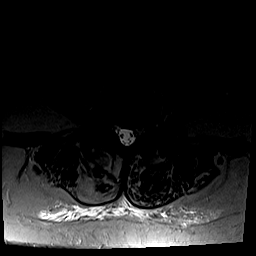
[im 12/41]
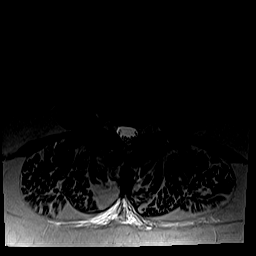
[im 18/41]
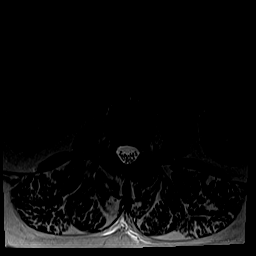
[im 21/41]
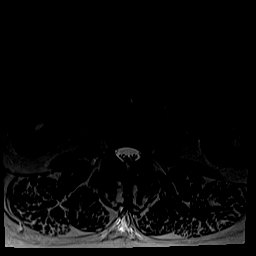
[im 23/41]
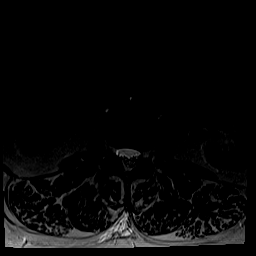
[im 29/41]
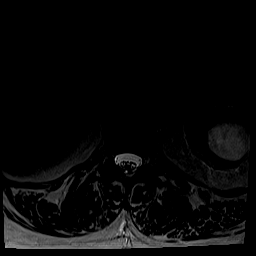
[im 35/41]
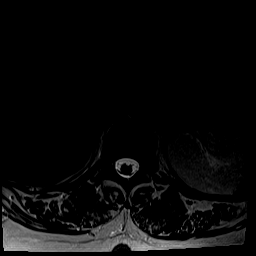
[im 41/41]
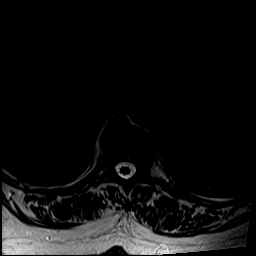

[Series 7: T1 · axial · 4.0mm · 0.39mm/px · z∈[-89,+108]mm · 4 of 41 slices shown (2 of 2)]
[im 1/41]
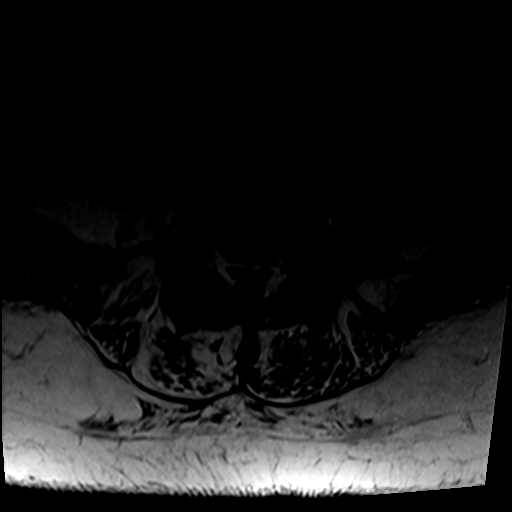
[im 6/41]
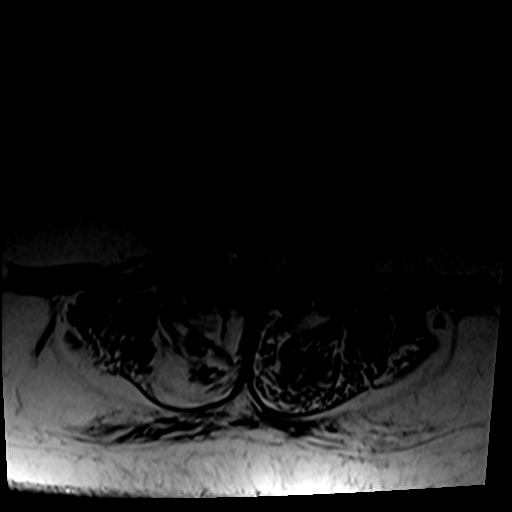
[im 21/41]
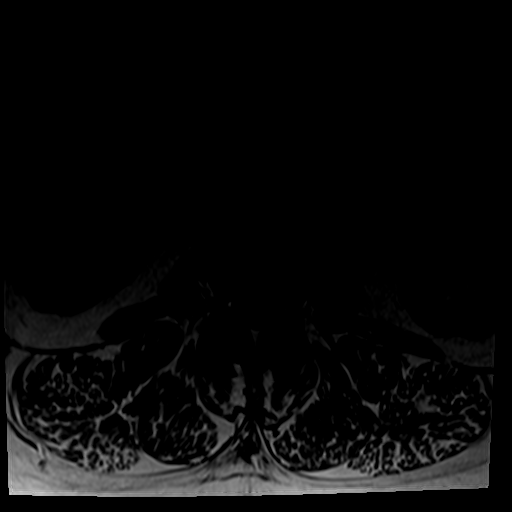
[im 35/41]
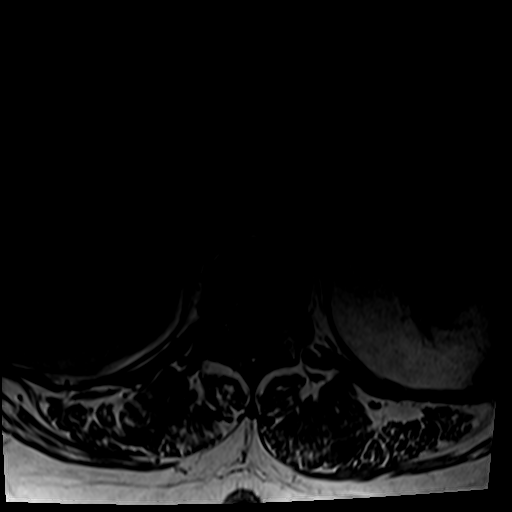

[25 of 48 positions shown; findings below may reference images not displayed]

FINDINGS: Segmentation: Transitional lumbosacral anatomy. Assuming the lowest
full size disc space is L5-S1, there are hypoplastic ribs at T12,
full size ribs at T11. T12 is not included on the comparison. This
results in L5-S1 assimilation joints. Correlation with radiographs
is recommended prior to any operative intervention.

Alignment: Mild levoconvex lumbar scoliosis. Preserved lumbar
lordosis. Subtle anterolisthesis of L4 on L5.

Vertebrae: No marrow edema or evidence of acute osseous abnormality.
Background bone marrow signal is heterogeneous but within normal
limits. No suspicious marrow lesion. Intact visible sacrum.

Conus medullaris and cauda equina: Conus extends to the T12-L1
level. No lower spinal cord or conus signal abnormality.

Paraspinal and other soft tissues: Benign-appearing left renal cyst.
Other visualized abdominal viscera and paraspinal soft tissues are
within normal limits.

Disc levels:

T10-T11: Mild disc desiccation and disc bulging. Mild facet
hypertrophy. No stenosis.

T11-T12: Moderate facet hypertrophy greater on the right. Only mild
right T11 foraminal stenosis.

T12-L1: Mild to moderate facet hypertrophy. No significant stenosis.

L1-L2: Mild to moderate facet hypertrophy. No significant stenosis.

L2-L3: Mild disc bulging. Moderate facet and ligament flavum
hypertrophy. No significant stenosis.

L3-L4: Mild disc bulging. Moderate to severe facet and ligament
flavum hypertrophy, possibly with developing facet ankylosis at this
level. No significant stenosis.

L4-L5: Subtle anterolisthesis. Mild disc bulging. Severe facet
hypertrophy (series 6, image 38). No significant spinal stenosis. No
convincing lateral recess stenosis. Mild to moderate left and mild
right L4 foraminal stenosis.

L5-S1: Transitional. Assimilation joint. Moderate facet hypertrophy
greater on the right. No stenosis.
IMPRESSION: 1. Transitional lumbosacral anatomy, with hypoplastic T12 ribs and
L5-S1 assimilation joints designated for the purposes of this
report. Correlation with radiographs is recommended prior to any
operative intervention.

2. The dominant spinal degenerative finding is facet arthropathy,
widespread in the lower thoracic and lumbar spine, and worst at the
L4-L5 level where subtle anterolisthesis is noted. Also there is
possible developing ankylosis of the chronically degenerated facets
at L3-L4.

3. No significant lumbar spinal stenosis. Up to moderate left and
mild right L4 neural foraminal stenosis.

## 2022-11-23 DIAGNOSIS — E785 Hyperlipidemia, unspecified: Secondary | ICD-10-CM | POA: Diagnosis not present

## 2022-11-23 DIAGNOSIS — E782 Mixed hyperlipidemia: Secondary | ICD-10-CM | POA: Diagnosis not present

## 2022-11-23 DIAGNOSIS — D638 Anemia in other chronic diseases classified elsewhere: Secondary | ICD-10-CM | POA: Diagnosis not present

## 2022-11-23 DIAGNOSIS — E1169 Type 2 diabetes mellitus with other specified complication: Secondary | ICD-10-CM | POA: Diagnosis not present

## 2022-11-30 DIAGNOSIS — N1832 Chronic kidney disease, stage 3b: Secondary | ICD-10-CM | POA: Diagnosis not present

## 2022-11-30 DIAGNOSIS — I129 Hypertensive chronic kidney disease with stage 1 through stage 4 chronic kidney disease, or unspecified chronic kidney disease: Secondary | ICD-10-CM | POA: Diagnosis not present

## 2022-11-30 DIAGNOSIS — Z86718 Personal history of other venous thrombosis and embolism: Secondary | ICD-10-CM | POA: Diagnosis not present

## 2022-11-30 DIAGNOSIS — D631 Anemia in chronic kidney disease: Secondary | ICD-10-CM | POA: Diagnosis not present

## 2022-11-30 DIAGNOSIS — E1122 Type 2 diabetes mellitus with diabetic chronic kidney disease: Secondary | ICD-10-CM | POA: Diagnosis not present

## 2022-11-30 DIAGNOSIS — E782 Mixed hyperlipidemia: Secondary | ICD-10-CM | POA: Diagnosis not present

## 2022-11-30 DIAGNOSIS — E1169 Type 2 diabetes mellitus with other specified complication: Secondary | ICD-10-CM | POA: Diagnosis not present

## 2023-03-08 DIAGNOSIS — E119 Type 2 diabetes mellitus without complications: Secondary | ICD-10-CM | POA: Diagnosis not present

## 2023-03-08 DIAGNOSIS — H40013 Open angle with borderline findings, low risk, bilateral: Secondary | ICD-10-CM | POA: Diagnosis not present

## 2023-05-23 ENCOUNTER — Other Ambulatory Visit (HOSPITAL_COMMUNITY): Payer: Self-pay

## 2023-05-23 ENCOUNTER — Other Ambulatory Visit: Payer: Self-pay

## 2023-05-23 MED ORDER — TRULICITY 3 MG/0.5ML ~~LOC~~ SOAJ
3.0000 mg | SUBCUTANEOUS | 1 refills | Status: AC
  Filled 2023-05-23 – 2023-06-29 (×2): qty 2, 28d supply, fill #0

## 2023-05-31 ENCOUNTER — Emergency Department
Admission: EM | Admit: 2023-05-31 | Discharge: 2023-05-31 | Disposition: A | Discharge status: Home / Self Care | Payer: Medicare HMO | Attending: Emergency Medicine | Admitting: Emergency Medicine

## 2023-05-31 ENCOUNTER — Emergency Department: Payer: Medicare HMO

## 2023-05-31 ENCOUNTER — Other Ambulatory Visit: Payer: Self-pay

## 2023-05-31 DIAGNOSIS — R200 Anesthesia of skin: Secondary | ICD-10-CM | POA: Diagnosis not present

## 2023-05-31 DIAGNOSIS — N189 Chronic kidney disease, unspecified: Secondary | ICD-10-CM | POA: Insufficient documentation

## 2023-05-31 DIAGNOSIS — E782 Mixed hyperlipidemia: Secondary | ICD-10-CM | POA: Diagnosis not present

## 2023-05-31 DIAGNOSIS — Z7901 Long term (current) use of anticoagulants: Secondary | ICD-10-CM | POA: Insufficient documentation

## 2023-05-31 DIAGNOSIS — E1169 Type 2 diabetes mellitus with other specified complication: Secondary | ICD-10-CM | POA: Diagnosis not present

## 2023-05-31 DIAGNOSIS — D638 Anemia in other chronic diseases classified elsewhere: Secondary | ICD-10-CM | POA: Diagnosis not present

## 2023-05-31 DIAGNOSIS — I129 Hypertensive chronic kidney disease with stage 1 through stage 4 chronic kidney disease, or unspecified chronic kidney disease: Secondary | ICD-10-CM | POA: Diagnosis not present

## 2023-05-31 DIAGNOSIS — E1122 Type 2 diabetes mellitus with diabetic chronic kidney disease: Secondary | ICD-10-CM | POA: Diagnosis not present

## 2023-05-31 DIAGNOSIS — E785 Hyperlipidemia, unspecified: Secondary | ICD-10-CM | POA: Diagnosis not present

## 2023-05-31 DIAGNOSIS — N1832 Chronic kidney disease, stage 3b: Secondary | ICD-10-CM | POA: Diagnosis not present

## 2023-05-31 DIAGNOSIS — I10 Essential (primary) hypertension: Secondary | ICD-10-CM | POA: Diagnosis not present

## 2023-05-31 DIAGNOSIS — R202 Paresthesia of skin: Secondary | ICD-10-CM | POA: Diagnosis not present

## 2023-05-31 LAB — COMPREHENSIVE METABOLIC PANEL
ALT: 16 U/L (ref 0–44)
AST: 22 U/L (ref 15–41)
Albumin: 4.2 g/dL (ref 3.5–5.0)
Alkaline Phosphatase: 58 U/L (ref 38–126)
Anion gap: 9 (ref 5–15)
BUN: 32 mg/dL — ABNORMAL HIGH (ref 8–23)
CO2: 25 mmol/L (ref 22–32)
Calcium: 10.9 mg/dL — ABNORMAL HIGH (ref 8.9–10.3)
Chloride: 108 mmol/L (ref 98–111)
Creatinine, Ser: 1.67 mg/dL — ABNORMAL HIGH (ref 0.44–1.00)
GFR, Estimated: 31 mL/min — ABNORMAL LOW (ref >60)
Glucose, Bld: 91 mg/dL (ref 70–99)
Potassium: 3.8 mmol/L (ref 3.5–5.1)
Sodium: 142 mmol/L (ref 135–145)
Total Bilirubin: 0.6 mg/dL (ref 0.3–1.2)
Total Protein: 7.5 g/dL (ref 6.5–8.1)

## 2023-05-31 LAB — PROTIME-INR
INR: 1.2 (ref 0.8–1.2)
Prothrombin Time: 15.4 seconds — ABNORMAL HIGH (ref 11.4–15.2)

## 2023-05-31 LAB — CBC WITH DIFFERENTIAL/PLATELET
Abs Immature Granulocytes: 0.01 10*3/uL (ref 0.00–0.07)
Basophils Absolute: 0 10*3/uL (ref 0.0–0.1)
Basophils Relative: 1 %
Eosinophils Absolute: 0.1 10*3/uL (ref 0.0–0.5)
Eosinophils Relative: 2 %
HCT: 37.2 % (ref 36.0–46.0)
Hemoglobin: 11.5 g/dL — ABNORMAL LOW (ref 12.0–15.0)
Immature Granulocytes: 0 %
Lymphocytes Relative: 49 %
Lymphs Abs: 2.3 10*3/uL (ref 0.7–4.0)
MCH: 27.3 pg (ref 26.0–34.0)
MCHC: 30.9 g/dL (ref 30.0–36.0)
MCV: 88.2 fL (ref 80.0–100.0)
Monocytes Absolute: 0.6 10*3/uL (ref 0.1–1.0)
Monocytes Relative: 13 %
Neutro Abs: 1.6 10*3/uL — ABNORMAL LOW (ref 1.7–7.7)
Neutrophils Relative %: 35 %
Platelets: 245 10*3/uL (ref 150–400)
RBC: 4.22 MIL/uL (ref 3.87–5.11)
RDW: 13.7 % (ref 11.5–15.5)
WBC: 4.6 10*3/uL (ref 4.0–10.5)
nRBC: 0 % (ref 0.0–0.2)

## 2023-05-31 NOTE — ED Triage Notes (Signed)
Pt presents to the ED due to mid lip numbness. Pt states the numbness started during the night. Pt states its feels the same on both sides. Pt denies CP or SOB. Pt states "I feel like I'm getting a fever blister".  Pt A&Ox4

## 2023-05-31 NOTE — ED Notes (Signed)
First Nurse Note: Pt to ED via [facility] for numbness in lower lip and chin that she woke up with. Pt went to bed normal. Pt does not have slurred speech. Pt is ambulatory without difficulty.

## 2023-05-31 NOTE — ED Provider Notes (Signed)
[facility] Provider Note    Event Date/Time   First MD Initiated Contact with Patient 05/31/23 1748     (approximate)   History   Chief Complaint Numbness (Mid lip)   HPI  [place] is a 77 y.o. female with past medical history of hypertension, hyperlipidemia, diabetes, CKD, and DVT/PE on Eliquis who presents to the ED complaining of lip numbness.  Patient reports that when she woke up this morning she noticed numbness in the middle of her lower lip as well as her chin.  Numbness does not affect either side of her face and she has not noticed any drooping in her face.  She denies any vision changes, speech changes, numbness, or weakness in her arms or legs.  She describes the numbness as a tingling feeling, similar to what she has experienced with cold sores in the past.     Physical Exam   Triage Vital Signs: ED Triage Vitals  Enc Vitals Group     BP 05/31/23 1657 (!) 150/67     Pulse Rate 05/31/23 1657 (!) 58     Resp 05/31/23 1657 16     Temp 05/31/23 1657 98.6 F (37 C)     Temp Source 05/31/23 1657 Oral     SpO2 05/31/23 1657 96 %     Weight 05/31/23 1659 202 lb (91.6 kg)     Height 05/31/23 1659 5\' 2"  (1.575 m)     Head Circumference --      Peak Flow --      Pain Score 05/31/23 1705 0     Pain Loc --      Pain Edu? --      Excl. in GC? --     Most recent vital signs: Vitals:   05/31/23 1657 05/31/23 1809  BP: (!) 150/67 116/80  Pulse: (!) 58 (!) 58  Resp: 16 16  Temp: 98.6 F (37 C)   SpO2: 96% 97%    Constitutional: Alert and oriented. Eyes: Conjunctivae are normal. Head: Atraumatic. Nose: No congestion/rhinnorhea. Mouth/Throat: Mucous membranes are moist.  No blistering or vesicles noted to lips. Cardiovascular: Normal rate, regular rhythm. Grossly normal heart sounds.  2+ radial pulses bilaterally. Respiratory: Normal respiratory effort.  No retractions. Lungs CTAB. Gastrointestinal: Soft and nontender. No  distention. Musculoskeletal: No lower extremity tenderness nor edema.  Neurologic:  Normal speech and language. No gross focal neurologic deficits are appreciated.    ED Results / Procedures / Treatments   Labs (all labs ordered are listed, but only abnormal results are displayed) Labs Reviewed  COMPREHENSIVE METABOLIC PANEL - Abnormal; Notable for the following components:      Result Value   BUN 32 (*)    Creatinine, Ser 1.67 (*)    Calcium 10.9 (*)    GFR, Estimated 31 (*)    All other components within normal limits  CBC WITH DIFFERENTIAL/PLATELET - Abnormal; Notable for the following components:   Hemoglobin 11.5 (*)    Neutro Abs 1.6 (*)    All other components within normal limits  PROTIME-INR - Abnormal; Notable for the following components:   Prothrombin Time 15.4 (*)    All other components within normal limits   RADIOLOGY CT head reviewed and interpreted by me with no hemorrhage or midline shift.  PROCEDURES:  Critical Care performed: No  Procedures   MEDICATIONS ORDERED IN ED: Medications - No data to display   IMPRESSION / MDM / ASSESSMENT AND PLAN / ED  COURSE  I reviewed the triage vital signs and the nursing notes.                              77 y.o. female with past medical history of hypertension, hyperlipidemia, diabetes, CKD, and DVT/PE on Eliquis who presents to the ED complaining of the middle of her lower lip as well as her chin since waking up this morning.  Patient's presentation is most consistent with acute presentation with potential threat to life or bodily function.  Differential diagnosis includes, but is not limited to, stroke, TIA, paresthesia, electrolyte abnormality, HSV.  Patient nontoxic-appearing and in no acute distress, vital signs are unremarkable.  She has no focal neurologic deficits on exam, complains only of numbness to the middle of her lower lip and chin.  This seems unlikely to represent stroke as she has no numbness  extending to either side of her face, describes numbness as a tingling that she typically has prior to developing a cold sore.  CT head is negative for acute process and labs show CKD similar to previous with no acute electrolyte abnormality, anemia, or leukocytosis.  INR is unremarkable.  Patient appropriate for discharge home with PCP follow-up, was offered medication for possible herpes labialis, but declines.  She was counseled to return to the ED for new or worsening symptoms, patient and family agree with plan.      FINAL CLINICAL IMPRESSION(S) / ED DIAGNOSES   Final diagnoses:  Numbness of lip     Rx / DC Orders   ED Discharge Orders     None        Note:  This document was prepared using Dragon voice recognition software and may include unintentional dictation errors.   Chesley Noon, MD 05/31/23 [phone]

## 2023-05-31 NOTE — ED Provider Triage Note (Signed)
Emergency Medicine Provider Triage Evaluation Note  Cindie Crumbly , a 77 y.o. female  was evaluated in triage.  Pt complains of Lip and chin numbness. Since last night around 2am she noticed lower lip and chin are numb. No edema. No HA.  Review of Systems  Positive: Chin and lip numbness Negative: HA, facial droop, slurred speech, unilateral weakness  Physical Exam  BP (!) 150/67 (BP Location: Left Arm)   Pulse (!) 58   Temp 98.6 F (37 C) (Oral)   Resp 16   Ht 5\' 2"  (1.575 m)   Wt 91.6 kg   SpO2 96%   BMI 36.95 kg/m  Gen:   Awake, no distress   Resp:  Normal effort  MSK:   Moves extremities without difficulty  Other:  Grossly neurologically intact  Medical Decision Making  Medically screening exam initiated at 5:01 PM.  Appropriate orders placed.  Cindie Crumbly was informed that the remainder of the evaluation will be completed by another provider, this initial triage assessment does not replace that evaluation, and the importance of remaining in the ED until their evaluation is complete.  CT, labs   Lanette Hampshire 05/31/23 1701

## 2023-06-06 ENCOUNTER — Telehealth: Payer: Self-pay

## 2023-06-06 NOTE — Telephone Encounter (Signed)
Transition Care Management Follow-up Telephone Call Date of discharge and from where: 05/31/2023 [facility] How have you been since you were released from the hospital? Patient is feeling better, the numbness in her lip is almost gone. Any questions or concerns? No  Items Reviewed: Did the pt receive and understand the discharge instructions provided? Yes  Medications obtained and verified? Yes  Other? No  Any new allergies since your discharge? No  Dietary orders reviewed? Yes Do you have support at home? Yes   Follow up appointments reviewed:  PCP Hospital f/u appt confirmed? Yes  Scheduled to see Marisue Ivan, MD on 06/07/2023 @ [facility]. Specialist Hospital f/u appt confirmed? No  Scheduled to see  on  @ . Are transportation arrangements needed? No  If their condition worsens, is the pt aware to call PCP or go to the Emergency Dept.? Yes Was the patient provided with contact information for the PCP's office or ED? Yes Was to pt encouraged to call back with questions or concerns? Yes  Calin Ellery [facility]  [facility] Population Health Community Resource Care Guide   [email or web address]  ?? [phone]   Website: [email or web address]  [email or web address]

## 2023-06-07 DIAGNOSIS — E785 Hyperlipidemia, unspecified: Secondary | ICD-10-CM | POA: Diagnosis not present

## 2023-06-07 DIAGNOSIS — I10 Essential (primary) hypertension: Secondary | ICD-10-CM | POA: Diagnosis not present

## 2023-06-07 DIAGNOSIS — E119 Type 2 diabetes mellitus without complications: Secondary | ICD-10-CM | POA: Diagnosis not present

## 2023-06-07 DIAGNOSIS — E669 Obesity, unspecified: Secondary | ICD-10-CM | POA: Diagnosis not present

## 2023-06-07 DIAGNOSIS — Z1331 Encounter for screening for depression: Secondary | ICD-10-CM | POA: Diagnosis not present

## 2023-06-07 DIAGNOSIS — Z0000 Encounter for general adult medical examination without abnormal findings: Secondary | ICD-10-CM | POA: Diagnosis not present

## 2023-06-07 DIAGNOSIS — Z6837 Body mass index (BMI) 37.0-37.9, adult: Secondary | ICD-10-CM | POA: Diagnosis not present

## 2023-06-29 ENCOUNTER — Other Ambulatory Visit: Payer: Self-pay

## 2023-09-15 ENCOUNTER — Other Ambulatory Visit: Payer: Self-pay | Admitting: Family Medicine

## 2023-09-15 DIAGNOSIS — Z1231 Encounter for screening mammogram for malignant neoplasm of breast: Secondary | ICD-10-CM

## 2023-10-05 ENCOUNTER — Ambulatory Visit
Admission: RE | Admit: 2023-10-05 | Discharge: 2023-10-05 | Disposition: A | Discharge status: Home / Self Care | Payer: Medicare HMO | Source: Ambulatory Visit | Attending: Family Medicine | Admitting: Family Medicine

## 2023-10-05 DIAGNOSIS — Z1231 Encounter for screening mammogram for malignant neoplasm of breast: Secondary | ICD-10-CM | POA: Insufficient documentation

## 2023-11-30 DIAGNOSIS — E785 Hyperlipidemia, unspecified: Secondary | ICD-10-CM | POA: Diagnosis not present

## 2023-11-30 DIAGNOSIS — E782 Mixed hyperlipidemia: Secondary | ICD-10-CM | POA: Diagnosis not present

## 2023-11-30 DIAGNOSIS — E1169 Type 2 diabetes mellitus with other specified complication: Secondary | ICD-10-CM | POA: Diagnosis not present

## 2023-11-30 DIAGNOSIS — I10 Essential (primary) hypertension: Secondary | ICD-10-CM | POA: Diagnosis not present

## 2023-12-07 DIAGNOSIS — I509 Heart failure, unspecified: Secondary | ICD-10-CM | POA: Diagnosis not present

## 2023-12-07 DIAGNOSIS — N184 Chronic kidney disease, stage 4 (severe): Secondary | ICD-10-CM | POA: Diagnosis not present

## 2023-12-07 DIAGNOSIS — E782 Mixed hyperlipidemia: Secondary | ICD-10-CM | POA: Diagnosis not present

## 2023-12-07 DIAGNOSIS — Z6836 Body mass index (BMI) 36.0-36.9, adult: Secondary | ICD-10-CM | POA: Diagnosis not present

## 2023-12-07 DIAGNOSIS — I129 Hypertensive chronic kidney disease with stage 1 through stage 4 chronic kidney disease, or unspecified chronic kidney disease: Secondary | ICD-10-CM | POA: Diagnosis not present

## 2023-12-07 DIAGNOSIS — E1122 Type 2 diabetes mellitus with diabetic chronic kidney disease: Secondary | ICD-10-CM | POA: Diagnosis not present

## 2023-12-07 DIAGNOSIS — I130 Hypertensive heart and chronic kidney disease with heart failure and stage 1 through stage 4 chronic kidney disease, or unspecified chronic kidney disease: Secondary | ICD-10-CM | POA: Diagnosis not present

## 2023-12-07 DIAGNOSIS — D631 Anemia in chronic kidney disease: Secondary | ICD-10-CM | POA: Diagnosis not present

## 2023-12-19 DIAGNOSIS — N184 Chronic kidney disease, stage 4 (severe): Secondary | ICD-10-CM | POA: Diagnosis not present

## 2023-12-19 DIAGNOSIS — I10 Essential (primary) hypertension: Secondary | ICD-10-CM | POA: Diagnosis not present

## 2023-12-19 DIAGNOSIS — E1122 Type 2 diabetes mellitus with diabetic chronic kidney disease: Secondary | ICD-10-CM | POA: Diagnosis not present

## 2023-12-27 DIAGNOSIS — I10 Essential (primary) hypertension: Secondary | ICD-10-CM | POA: Diagnosis not present

## 2023-12-27 DIAGNOSIS — E1122 Type 2 diabetes mellitus with diabetic chronic kidney disease: Secondary | ICD-10-CM | POA: Diagnosis not present

## 2023-12-27 DIAGNOSIS — N184 Chronic kidney disease, stage 4 (severe): Secondary | ICD-10-CM | POA: Diagnosis not present

## 2024-01-03 DIAGNOSIS — Z0100 Encounter for examination of eyes and vision without abnormal findings: Secondary | ICD-10-CM | POA: Diagnosis not present

## 2024-01-03 DIAGNOSIS — E119 Type 2 diabetes mellitus without complications: Secondary | ICD-10-CM | POA: Diagnosis not present

## 2024-01-03 DIAGNOSIS — H40013 Open angle with borderline findings, low risk, bilateral: Secondary | ICD-10-CM | POA: Diagnosis not present

## 2024-01-29 ENCOUNTER — Other Ambulatory Visit: Payer: Self-pay | Admitting: Nurse Practitioner

## 2024-01-29 DIAGNOSIS — E1122 Type 2 diabetes mellitus with diabetic chronic kidney disease: Secondary | ICD-10-CM | POA: Diagnosis not present

## 2024-01-29 DIAGNOSIS — I10 Essential (primary) hypertension: Secondary | ICD-10-CM | POA: Diagnosis not present

## 2024-01-29 DIAGNOSIS — D631 Anemia in chronic kidney disease: Secondary | ICD-10-CM | POA: Diagnosis not present

## 2024-01-29 DIAGNOSIS — N2581 Secondary hyperparathyroidism of renal origin: Secondary | ICD-10-CM | POA: Diagnosis not present

## 2024-01-29 DIAGNOSIS — N184 Chronic kidney disease, stage 4 (severe): Secondary | ICD-10-CM | POA: Diagnosis not present

## 2024-02-06 ENCOUNTER — Ambulatory Visit
Admission: RE | Admit: 2024-02-06 | Discharge: 2024-02-06 | Disposition: A | Discharge status: Home / Self Care | Payer: Medicare HMO | Source: Ambulatory Visit | Attending: Nurse Practitioner | Admitting: Nurse Practitioner

## 2024-02-06 DIAGNOSIS — E1122 Type 2 diabetes mellitus with diabetic chronic kidney disease: Secondary | ICD-10-CM | POA: Diagnosis not present

## 2024-02-06 DIAGNOSIS — D631 Anemia in chronic kidney disease: Secondary | ICD-10-CM | POA: Diagnosis not present

## 2024-02-06 DIAGNOSIS — N184 Chronic kidney disease, stage 4 (severe): Secondary | ICD-10-CM

## 2024-02-06 DIAGNOSIS — I129 Hypertensive chronic kidney disease with stage 1 through stage 4 chronic kidney disease, or unspecified chronic kidney disease: Secondary | ICD-10-CM | POA: Diagnosis not present

## 2024-02-06 DIAGNOSIS — N281 Cyst of kidney, acquired: Secondary | ICD-10-CM | POA: Diagnosis not present

## 2024-02-07 ENCOUNTER — Other Ambulatory Visit: Payer: Self-pay

## 2024-02-28 DIAGNOSIS — E785 Hyperlipidemia, unspecified: Secondary | ICD-10-CM | POA: Diagnosis not present

## 2024-02-28 DIAGNOSIS — D638 Anemia in other chronic diseases classified elsewhere: Secondary | ICD-10-CM | POA: Diagnosis not present

## 2024-02-28 DIAGNOSIS — E1169 Type 2 diabetes mellitus with other specified complication: Secondary | ICD-10-CM | POA: Diagnosis not present

## 2024-02-28 DIAGNOSIS — E782 Mixed hyperlipidemia: Secondary | ICD-10-CM | POA: Diagnosis not present

## 2024-02-28 DIAGNOSIS — N184 Chronic kidney disease, stage 4 (severe): Secondary | ICD-10-CM | POA: Diagnosis not present

## 2024-03-11 DIAGNOSIS — E1122 Type 2 diabetes mellitus with diabetic chronic kidney disease: Secondary | ICD-10-CM | POA: Diagnosis not present

## 2024-03-11 DIAGNOSIS — I5032 Chronic diastolic (congestive) heart failure: Secondary | ICD-10-CM | POA: Diagnosis not present

## 2024-03-11 DIAGNOSIS — N184 Chronic kidney disease, stage 4 (severe): Secondary | ICD-10-CM | POA: Diagnosis not present

## 2024-03-11 DIAGNOSIS — Z8739 Personal history of other diseases of the musculoskeletal system and connective tissue: Secondary | ICD-10-CM | POA: Diagnosis not present

## 2024-03-11 DIAGNOSIS — I130 Hypertensive heart and chronic kidney disease with heart failure and stage 1 through stage 4 chronic kidney disease, or unspecified chronic kidney disease: Secondary | ICD-10-CM | POA: Diagnosis not present

## 2024-03-11 DIAGNOSIS — E782 Mixed hyperlipidemia: Secondary | ICD-10-CM | POA: Diagnosis not present

## 2024-03-11 DIAGNOSIS — R011 Cardiac murmur, unspecified: Secondary | ICD-10-CM | POA: Diagnosis not present

## 2024-03-11 DIAGNOSIS — D631 Anemia in chronic kidney disease: Secondary | ICD-10-CM | POA: Diagnosis not present

## 2024-03-18 DIAGNOSIS — E1122 Type 2 diabetes mellitus with diabetic chronic kidney disease: Secondary | ICD-10-CM | POA: Diagnosis not present

## 2024-03-18 DIAGNOSIS — N184 Chronic kidney disease, stage 4 (severe): Secondary | ICD-10-CM | POA: Diagnosis not present

## 2024-03-18 DIAGNOSIS — N2581 Secondary hyperparathyroidism of renal origin: Secondary | ICD-10-CM | POA: Diagnosis not present

## 2024-03-18 DIAGNOSIS — I10 Essential (primary) hypertension: Secondary | ICD-10-CM | POA: Diagnosis not present

## 2024-03-19 DIAGNOSIS — R011 Cardiac murmur, unspecified: Secondary | ICD-10-CM | POA: Diagnosis not present

## 2024-03-19 DIAGNOSIS — I5032 Chronic diastolic (congestive) heart failure: Secondary | ICD-10-CM | POA: Diagnosis not present

## 2024-03-21 DIAGNOSIS — I10 Essential (primary) hypertension: Secondary | ICD-10-CM | POA: Diagnosis not present

## 2024-03-21 DIAGNOSIS — E1122 Type 2 diabetes mellitus with diabetic chronic kidney disease: Secondary | ICD-10-CM | POA: Diagnosis not present

## 2024-03-21 DIAGNOSIS — D631 Anemia in chronic kidney disease: Secondary | ICD-10-CM | POA: Diagnosis not present

## 2024-03-21 DIAGNOSIS — N2581 Secondary hyperparathyroidism of renal origin: Secondary | ICD-10-CM | POA: Diagnosis not present

## 2024-03-21 DIAGNOSIS — N184 Chronic kidney disease, stage 4 (severe): Secondary | ICD-10-CM | POA: Diagnosis not present

## 2024-04-18 DIAGNOSIS — E1169 Type 2 diabetes mellitus with other specified complication: Secondary | ICD-10-CM | POA: Diagnosis not present

## 2024-04-18 DIAGNOSIS — N184 Chronic kidney disease, stage 4 (severe): Secondary | ICD-10-CM | POA: Diagnosis not present

## 2024-04-18 DIAGNOSIS — E213 Hyperparathyroidism, unspecified: Secondary | ICD-10-CM | POA: Diagnosis not present

## 2024-04-18 DIAGNOSIS — E785 Hyperlipidemia, unspecified: Secondary | ICD-10-CM | POA: Diagnosis not present

## 2024-04-26 DIAGNOSIS — E213 Hyperparathyroidism, unspecified: Secondary | ICD-10-CM | POA: Diagnosis not present

## 2024-05-07 DIAGNOSIS — N184 Chronic kidney disease, stage 4 (severe): Secondary | ICD-10-CM | POA: Diagnosis not present

## 2024-05-07 DIAGNOSIS — I10 Essential (primary) hypertension: Secondary | ICD-10-CM | POA: Diagnosis not present

## 2024-05-07 DIAGNOSIS — E1122 Type 2 diabetes mellitus with diabetic chronic kidney disease: Secondary | ICD-10-CM | POA: Diagnosis not present

## 2024-05-07 DIAGNOSIS — D631 Anemia in chronic kidney disease: Secondary | ICD-10-CM | POA: Diagnosis not present

## 2024-06-05 DIAGNOSIS — D631 Anemia in chronic kidney disease: Secondary | ICD-10-CM | POA: Diagnosis not present

## 2024-06-05 DIAGNOSIS — E785 Hyperlipidemia, unspecified: Secondary | ICD-10-CM | POA: Diagnosis not present

## 2024-06-05 DIAGNOSIS — E1122 Type 2 diabetes mellitus with diabetic chronic kidney disease: Secondary | ICD-10-CM | POA: Diagnosis not present

## 2024-06-05 DIAGNOSIS — Z780 Asymptomatic menopausal state: Secondary | ICD-10-CM | POA: Diagnosis not present

## 2024-06-05 DIAGNOSIS — Z8739 Personal history of other diseases of the musculoskeletal system and connective tissue: Secondary | ICD-10-CM | POA: Diagnosis not present

## 2024-06-05 DIAGNOSIS — Z09 Encounter for follow-up examination after completed treatment for conditions other than malignant neoplasm: Secondary | ICD-10-CM | POA: Diagnosis not present

## 2024-06-05 DIAGNOSIS — N184 Chronic kidney disease, stage 4 (severe): Secondary | ICD-10-CM | POA: Diagnosis not present

## 2024-06-05 DIAGNOSIS — E782 Mixed hyperlipidemia: Secondary | ICD-10-CM | POA: Diagnosis not present

## 2024-06-12 DIAGNOSIS — Z0000 Encounter for general adult medical examination without abnormal findings: Secondary | ICD-10-CM | POA: Diagnosis not present

## 2024-06-12 DIAGNOSIS — I129 Hypertensive chronic kidney disease with stage 1 through stage 4 chronic kidney disease, or unspecified chronic kidney disease: Secondary | ICD-10-CM | POA: Diagnosis not present

## 2024-06-12 DIAGNOSIS — E213 Hyperparathyroidism, unspecified: Secondary | ICD-10-CM | POA: Diagnosis not present

## 2024-06-12 DIAGNOSIS — E1169 Type 2 diabetes mellitus with other specified complication: Secondary | ICD-10-CM | POA: Diagnosis not present

## 2024-06-12 DIAGNOSIS — N184 Chronic kidney disease, stage 4 (severe): Secondary | ICD-10-CM | POA: Diagnosis not present

## 2024-06-12 DIAGNOSIS — E785 Hyperlipidemia, unspecified: Secondary | ICD-10-CM | POA: Diagnosis not present

## 2024-06-12 DIAGNOSIS — D631 Anemia in chronic kidney disease: Secondary | ICD-10-CM | POA: Diagnosis not present

## 2024-06-12 DIAGNOSIS — N189 Chronic kidney disease, unspecified: Secondary | ICD-10-CM | POA: Diagnosis not present

## 2024-06-12 DIAGNOSIS — Z1331 Encounter for screening for depression: Secondary | ICD-10-CM | POA: Diagnosis not present

## 2024-06-18 DIAGNOSIS — Z87891 Personal history of nicotine dependence: Secondary | ICD-10-CM | POA: Diagnosis not present

## 2024-06-18 DIAGNOSIS — Z7901 Long term (current) use of anticoagulants: Secondary | ICD-10-CM | POA: Diagnosis not present

## 2024-06-18 DIAGNOSIS — Z7985 Long-term (current) use of injectable non-insulin antidiabetic drugs: Secondary | ICD-10-CM | POA: Diagnosis not present

## 2024-06-18 DIAGNOSIS — E210 Primary hyperparathyroidism: Secondary | ICD-10-CM | POA: Diagnosis not present

## 2024-06-18 DIAGNOSIS — E1122 Type 2 diabetes mellitus with diabetic chronic kidney disease: Secondary | ICD-10-CM | POA: Diagnosis not present

## 2024-06-18 DIAGNOSIS — Z79899 Other long term (current) drug therapy: Secondary | ICD-10-CM | POA: Diagnosis not present

## 2024-06-18 DIAGNOSIS — Z7984 Long term (current) use of oral hypoglycemic drugs: Secondary | ICD-10-CM | POA: Diagnosis not present

## 2024-06-18 DIAGNOSIS — I129 Hypertensive chronic kidney disease with stage 1 through stage 4 chronic kidney disease, or unspecified chronic kidney disease: Secondary | ICD-10-CM | POA: Diagnosis not present

## 2024-06-18 DIAGNOSIS — N189 Chronic kidney disease, unspecified: Secondary | ICD-10-CM | POA: Diagnosis not present

## 2024-07-10 DIAGNOSIS — N184 Chronic kidney disease, stage 4 (severe): Secondary | ICD-10-CM | POA: Diagnosis not present

## 2024-07-10 DIAGNOSIS — I10 Essential (primary) hypertension: Secondary | ICD-10-CM | POA: Diagnosis not present

## 2024-07-10 DIAGNOSIS — E1122 Type 2 diabetes mellitus with diabetic chronic kidney disease: Secondary | ICD-10-CM | POA: Diagnosis not present

## 2024-07-10 DIAGNOSIS — D631 Anemia in chronic kidney disease: Secondary | ICD-10-CM | POA: Diagnosis not present

## 2024-08-21 DIAGNOSIS — D351 Benign neoplasm of parathyroid gland: Secondary | ICD-10-CM | POA: Diagnosis not present

## 2024-08-21 DIAGNOSIS — E210 Primary hyperparathyroidism: Secondary | ICD-10-CM | POA: Diagnosis not present

## 2024-08-21 DIAGNOSIS — E1122 Type 2 diabetes mellitus with diabetic chronic kidney disease: Secondary | ICD-10-CM | POA: Diagnosis not present

## 2024-08-21 DIAGNOSIS — N189 Chronic kidney disease, unspecified: Secondary | ICD-10-CM | POA: Diagnosis not present

## 2024-08-21 DIAGNOSIS — I129 Hypertensive chronic kidney disease with stage 1 through stage 4 chronic kidney disease, or unspecified chronic kidney disease: Secondary | ICD-10-CM | POA: Diagnosis not present

## 2024-09-03 ENCOUNTER — Other Ambulatory Visit: Payer: Self-pay | Admitting: Family Medicine

## 2024-09-03 DIAGNOSIS — Z1231 Encounter for screening mammogram for malignant neoplasm of breast: Secondary | ICD-10-CM

## 2024-09-10 DIAGNOSIS — E210 Primary hyperparathyroidism: Secondary | ICD-10-CM | POA: Diagnosis not present

## 2024-09-18 DIAGNOSIS — N184 Chronic kidney disease, stage 4 (severe): Secondary | ICD-10-CM | POA: Diagnosis not present

## 2024-09-18 DIAGNOSIS — I10 Essential (primary) hypertension: Secondary | ICD-10-CM | POA: Diagnosis not present

## 2024-09-18 DIAGNOSIS — E1122 Type 2 diabetes mellitus with diabetic chronic kidney disease: Secondary | ICD-10-CM | POA: Diagnosis not present

## 2024-10-02 DIAGNOSIS — E1122 Type 2 diabetes mellitus with diabetic chronic kidney disease: Secondary | ICD-10-CM | POA: Diagnosis not present

## 2024-10-02 DIAGNOSIS — Z7985 Long-term (current) use of injectable non-insulin antidiabetic drugs: Secondary | ICD-10-CM | POA: Diagnosis not present

## 2024-10-02 DIAGNOSIS — E210 Primary hyperparathyroidism: Secondary | ICD-10-CM | POA: Diagnosis not present

## 2024-10-02 DIAGNOSIS — D631 Anemia in chronic kidney disease: Secondary | ICD-10-CM | POA: Diagnosis not present

## 2024-10-02 DIAGNOSIS — I10 Essential (primary) hypertension: Secondary | ICD-10-CM | POA: Diagnosis not present

## 2024-10-02 DIAGNOSIS — N184 Chronic kidney disease, stage 4 (severe): Secondary | ICD-10-CM | POA: Diagnosis not present

## 2024-10-07 ENCOUNTER — Ambulatory Visit
Admission: RE | Admit: 2024-10-07 | Discharge: 2024-10-07 | Disposition: A | Discharge status: Home / Self Care | Source: Ambulatory Visit | Attending: Family Medicine | Admitting: Family Medicine

## 2024-10-07 DIAGNOSIS — Z1231 Encounter for screening mammogram for malignant neoplasm of breast: Secondary | ICD-10-CM | POA: Diagnosis not present

## 2024-10-07 DIAGNOSIS — E213 Hyperparathyroidism, unspecified: Secondary | ICD-10-CM | POA: Diagnosis not present

## 2024-10-15 DIAGNOSIS — E559 Vitamin D deficiency, unspecified: Secondary | ICD-10-CM | POA: Diagnosis not present

## 2024-10-15 DIAGNOSIS — E213 Hyperparathyroidism, unspecified: Secondary | ICD-10-CM | POA: Diagnosis not present

## 2024-10-15 DIAGNOSIS — N184 Chronic kidney disease, stage 4 (severe): Secondary | ICD-10-CM | POA: Diagnosis not present

## 2024-12-21 ENCOUNTER — Other Ambulatory Visit: Payer: Self-pay

## 2024-12-21 ENCOUNTER — Emergency Department
Admission: EM | Admit: 2024-12-21 | Discharge: 2024-12-21 | Disposition: A | Discharge status: Home / Self Care | Source: Home / Self Care | Attending: Emergency Medicine | Admitting: Emergency Medicine

## 2024-12-21 DIAGNOSIS — N189 Chronic kidney disease, unspecified: Secondary | ICD-10-CM | POA: Diagnosis not present

## 2024-12-21 DIAGNOSIS — Z7901 Long term (current) use of anticoagulants: Secondary | ICD-10-CM | POA: Diagnosis not present

## 2024-12-21 DIAGNOSIS — I129 Hypertensive chronic kidney disease with stage 1 through stage 4 chronic kidney disease, or unspecified chronic kidney disease: Secondary | ICD-10-CM | POA: Insufficient documentation

## 2024-12-21 DIAGNOSIS — E1122 Type 2 diabetes mellitus with diabetic chronic kidney disease: Secondary | ICD-10-CM | POA: Diagnosis not present

## 2024-12-21 DIAGNOSIS — H5789 Other specified disorders of eye and adnexa: Secondary | ICD-10-CM | POA: Diagnosis present

## 2024-12-21 DIAGNOSIS — H1132 Conjunctival hemorrhage, left eye: Secondary | ICD-10-CM | POA: Insufficient documentation

## 2024-12-21 NOTE — ED Triage Notes (Signed)
 Pt to ED via POV from home. Pt reports left eye was itching and rubbed it and then started to have blood pool. Pt denies pain or vision changes on arrival.

## 2024-12-21 NOTE — Discharge Instructions (Addendum)
 As we discussed, plain lubricating eyedrops.  Do not use antired eye drops  Can also use a cool wet washcloth  Follow-up with your eye doctor as scheduled next week

## 2024-12-21 NOTE — ED Provider Notes (Signed)
 "  [facility] Provider Note    Event Date/Time   First MD Initiated Contact with Patient 12/21/24 1824     (approximate)   History   Eye Problem   HPI  Crystal Terrell is a 79 y.o. female who presents to the ED for evaluation of Eye Problem   Review of routine PCP visit from 12/29.  History of HTN DM, CKD.  Anticoagulated on Eliquis   Patient presents alongside her daughter for evaluation of left eye bloodiness.  Reports she had an itch to her left eye and rubbed it a little bit too hard.  Vision is normal out of this eye.  She does not like how it looks as she is to go to church tomorrow   Physical Exam   Triage Vital Signs: ED Triage Vitals  Encounter Vitals Group     BP 12/21/24 1816 (!) 148/63     Girls Systolic BP Percentile --      Girls Diastolic BP Percentile --      Boys Systolic BP Percentile --      Boys Diastolic BP Percentile --      Pulse Rate 12/21/24 1814 (!) 58     Resp 12/21/24 1814 18     Temp 12/21/24 1814 98.1 F (36.7 C)     Temp Source 12/21/24 1814 Oral     SpO2 12/21/24 1814 98 %     Weight --      Height --      Head Circumference --      Peak Flow --      Pain Score 12/21/24 1815 0     Pain Loc --      Pain Education --      Exclude from Growth Chart --     Most recent vital signs: Vitals:   12/21/24 1814 12/21/24 1816  BP:  (!) 148/63  Pulse: (!) 58   Resp: 18   Temp: 98.1 F (36.7 C)   SpO2: 98%     General: Awake, no distress.  CV:  Good peripheral perfusion.  Resp:  Normal effort.  Abd:  No distention.  MSK:  No deformity noted.  Neuro:  No focal deficits appreciated. Other:  Left-sided subconjunctival hemorrhage.  Pupils are PERRL with EOM intact, no proptosis or signs of open globe   ED Results / Procedures / Treatments   Labs (all labs ordered are listed, but only abnormal results are displayed) Labs Reviewed - No data to display  EKG   RADIOLOGY   Official radiology  report(s): No results found.  PROCEDURES and INTERVENTIONS:  Procedures  Medications - No data to display   IMPRESSION / MDM / ASSESSMENT AND PLAN / ED COURSE  I reviewed the triage vital signs and the nursing notes.  Differential diagnosis includes, but is not limited to, corneal abrasion, open globe, subconjunctival hemorrhage  Anticoagulated patient presents to the ED with a subconjunctival hemorrhage suitable for outpatient management.  Looks well without signs of additional pathology on exam.  Fortuitously she reports having eye doctor visit later next week.  We discussed conservative measures and return precautions     FINAL CLINICAL IMPRESSION(S) / ED DIAGNOSES   Final diagnoses:  Subconjunctival hematoma, left     Rx / DC Orders   ED Discharge Orders     None        Note:  This document was prepared using Dragon voice recognition software and may include unintentional dictation errors.  Claudene Rover, MD 12/21/24 [phone]  "
